# Patient Record
Sex: Male | Born: 1980
Health system: Southern US, Community
[De-identification: ages and names within clinical notes are randomized; demographics above are authoritative.]

## PROBLEM LIST (undated history)

## (undated) DIAGNOSIS — E785 Hyperlipidemia, unspecified: Secondary | ICD-10-CM

## (undated) DIAGNOSIS — E109 Type 1 diabetes mellitus without complications: Secondary | ICD-10-CM

## (undated) DIAGNOSIS — M2530 Other instability, unspecified joint: Secondary | ICD-10-CM

## (undated) HISTORY — DX: Hyperlipidemia, unspecified: E78.5

## (undated) HISTORY — DX: Type 1 diabetes mellitus without complications: E10.9

## (undated) HISTORY — DX: Other instability, unspecified joint: M25.30

---
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
## 2010-05-08 ENCOUNTER — Encounter: Admission: RE | Admit: 2010-05-08 | Discharge: 2010-05-08 | Payer: Self-pay | Admitting: Internal Medicine

## 2013-01-16 ENCOUNTER — Other Ambulatory Visit: Payer: Self-pay | Admitting: *Deleted

## 2013-01-16 MED ORDER — INSULIN LISPRO 100 UNIT/ML ~~LOC~~ SOLN: SUBCUTANEOUS | Status: DC

## 2013-08-29 DIAGNOSIS — E109 Type 1 diabetes mellitus without complications: Secondary | ICD-10-CM | POA: Insufficient documentation

## 2013-08-29 DIAGNOSIS — I10 Essential (primary) hypertension: Secondary | ICD-10-CM | POA: Insufficient documentation

## 2013-08-29 DIAGNOSIS — R200 Anesthesia of skin: Secondary | ICD-10-CM | POA: Insufficient documentation

## 2013-08-29 DIAGNOSIS — R079 Chest pain, unspecified: Secondary | ICD-10-CM | POA: Insufficient documentation

## 2013-08-29 DIAGNOSIS — Z9641 Presence of insulin pump (external) (internal): Secondary | ICD-10-CM | POA: Insufficient documentation

## 2013-08-29 DIAGNOSIS — E1069 Type 1 diabetes mellitus with other specified complication: Secondary | ICD-10-CM | POA: Insufficient documentation

## 2013-09-12 ENCOUNTER — Ambulatory Visit: Payer: Self-pay | Admitting: Internal Medicine

## 2013-10-04 DIAGNOSIS — K589 Irritable bowel syndrome without diarrhea: Secondary | ICD-10-CM | POA: Insufficient documentation

## 2013-10-04 DIAGNOSIS — M25562 Pain in left knee: Secondary | ICD-10-CM | POA: Insufficient documentation

## 2013-10-18 DIAGNOSIS — M2242 Chondromalacia patellae, left knee: Secondary | ICD-10-CM | POA: Insufficient documentation

## 2013-10-18 DIAGNOSIS — M2241 Chondromalacia patellae, right knee: Secondary | ICD-10-CM | POA: Insufficient documentation

## 2014-01-05 DIAGNOSIS — Z8639 Personal history of other endocrine, nutritional and metabolic disease: Secondary | ICD-10-CM | POA: Insufficient documentation

## 2014-01-25 DIAGNOSIS — E113299 Type 2 diabetes mellitus with mild nonproliferative diabetic retinopathy without macular edema, unspecified eye: Secondary | ICD-10-CM | POA: Insufficient documentation

## 2014-05-22 DIAGNOSIS — I152 Hypertension secondary to endocrine disorders: Secondary | ICD-10-CM | POA: Insufficient documentation

## 2014-05-29 DIAGNOSIS — I152 Hypertension secondary to endocrine disorders: Secondary | ICD-10-CM | POA: Insufficient documentation

## 2014-05-29 DIAGNOSIS — E103299 Type 1 diabetes mellitus with mild nonproliferative diabetic retinopathy without macular edema, unspecified eye: Secondary | ICD-10-CM | POA: Insufficient documentation

## 2014-10-29 DIAGNOSIS — Z794 Long term (current) use of insulin: Secondary | ICD-10-CM | POA: Insufficient documentation

## 2016-03-23 ENCOUNTER — Encounter (HOSPITAL_COMMUNITY): Payer: Self-pay | Admitting: *Deleted

## 2016-03-23 ENCOUNTER — Emergency Department (HOSPITAL_COMMUNITY)
Admission: EM | Admit: 2016-03-23 | Discharge: 2016-03-23 | Disposition: A | Discharge status: Home / Self Care | Payer: BC Managed Care – PPO | Attending: Emergency Medicine | Admitting: Emergency Medicine

## 2016-03-23 ENCOUNTER — Emergency Department (HOSPITAL_COMMUNITY): Payer: BC Managed Care – PPO

## 2016-03-23 DIAGNOSIS — E785 Hyperlipidemia, unspecified: Secondary | ICD-10-CM | POA: Insufficient documentation

## 2016-03-23 DIAGNOSIS — S60450A Superficial foreign body of right index finger, initial encounter: Secondary | ICD-10-CM | POA: Diagnosis present

## 2016-03-23 DIAGNOSIS — I10 Essential (primary) hypertension: Secondary | ICD-10-CM | POA: Diagnosis not present

## 2016-03-23 DIAGNOSIS — W458XXA Other foreign body or object entering through skin, initial encounter: Secondary | ICD-10-CM | POA: Diagnosis not present

## 2016-03-23 DIAGNOSIS — Y939 Activity, unspecified: Secondary | ICD-10-CM | POA: Diagnosis not present

## 2016-03-23 DIAGNOSIS — Z794 Long term (current) use of insulin: Secondary | ICD-10-CM | POA: Insufficient documentation

## 2016-03-23 DIAGNOSIS — S6991XA Unspecified injury of right wrist, hand and finger(s), initial encounter: Secondary | ICD-10-CM

## 2016-03-23 DIAGNOSIS — Y929 Unspecified place or not applicable: Secondary | ICD-10-CM | POA: Insufficient documentation

## 2016-03-23 DIAGNOSIS — Y999 Unspecified external cause status: Secondary | ICD-10-CM | POA: Diagnosis not present

## 2016-03-23 DIAGNOSIS — E109 Type 1 diabetes mellitus without complications: Secondary | ICD-10-CM | POA: Insufficient documentation

## 2016-03-23 MED ORDER — LIDOCAINE HCL (PF) 2 % IJ SOLN
10.0000 mL | Freq: Once | INTRAMUSCULAR | Status: AC
  Administered 2016-03-23: 10 mL via INTRADERMAL
  Filled 2016-03-23: qty 10

## 2016-03-23 MED ORDER — TETANUS-DIPHTH-ACELL PERTUSSIS 5-2.5-18.5 LF-MCG/0.5 IM SUSP
0.5000 mL | Freq: Once | INTRAMUSCULAR | Status: AC
  Administered 2016-03-23: 0.5 mL via INTRAMUSCULAR
  Filled 2016-03-23: qty 0.5

## 2016-03-23 MED ORDER — DOXYCYCLINE HYCLATE 100 MG PO CAPS: 100.0000 mg | ORAL_CAPSULE | Freq: Two times a day (BID) | ORAL | Status: DC

## 2016-03-23 NOTE — ED Notes (Signed)
Pt has fish hook in his right hand pointer finger. Bleeding is controlled at this time.

## 2016-03-23 NOTE — ED Provider Notes (Signed)
CSN: [id number]     Arrival date & time 03/23/16  1617 History  By signing my name below, I, Thomas Ramsey, attest that this documentation has been prepared under the direction and in the presence of Mattie MarlinJessica Tigerlily Christine, PA-C. Electronically Signed: Bridgette HabermannMaria Ramsey, ED Scribe. 03/23/2016. 6:58 PM.   Chief Complaint  Patient presents with  . Foreign Body in Skin   The history is provided by the patient. No language interpreter was used.    HPI Comments: Thomas Ramsey is a 35 y.o. male who presents to the Emergency Department for a fish hook stuck in his right hand pointer finger today around 4pm. Bleeding is controlled at this time. Pt states he does not feel any pain at this time, but notes he feels pain when he tries to pull the hook up. Pt got his Tdap shot updated during this visit. Patient is currently asymptomatic at this time and has no other complaints.    Past Medical History  Diagnosis Date  . HTN (hypertension)   . Hyperlipemia   . Type I (juvenile type) diabetes mellitus without mention of complication, not stated as uncontrolled    Past Surgical History  Procedure Laterality Date  . Finger surgery  1984    Reattached Index finger (right hand)   . Tympanostomy tube placement  1984  . Tonsillectomy  1992   No family history on file. Social History  Substance Use Topics  . Smoking status: Never Smoker   . Smokeless tobacco: None  . Alcohol Use: No    Review of Systems  Constitutional: Negative for fever.  Skin: Positive for wound.  Neurological: Negative for syncope.  All other systems reviewed and are negative.     Allergies  Review of patient's allergies indicates no known allergies.  Home Medications   Prior to Admission medications   Medication Sig Start Date End Date Taking? Authorizing Provider  doxycycline (VIBRAMYCIN) 100 MG capsule Take 1 capsule (100 mg total) by mouth 2 (two) times daily. 03/23/16   Jerre SimonJessica L Shafter Jupin, PA  insulin lispro (HUMALOG) 100 UNIT/ML  injection Inject 75 units once a day for diabetes, via insulin pump  Dx. 250.83 01/16/13   Tiffany L Reed, DO   BP 144/90 mmHg  Pulse 95  Temp(Src) 98.3 F (36.8 C) (Oral)  Resp 18  Ht 6' (1.829 m)  Wt 225 lb (102.059 kg)  BMI 30.51 kg/m2  SpO2 98% Physical Exam  Constitutional: He appears well-developed and well-nourished. No distress.  HENT:  Head: Normocephalic and atraumatic.  Eyes: Conjunctivae are normal.  Pulmonary/Chest: Effort normal.  Neurological: He is alert. Coordination normal.  Skin: Skin is warm and dry.  Fish hook in the right index finger palmar aspect between the PIP and DIP joint, no bleeding, redness, edema, no signs of infection. Sensation intact, cap refill less than 2 seconds, full AROM of fingers and wrist, 5/5 strength, Patient is neurovascular intact distally.  Psychiatric: He has a normal mood and affect. His behavior is normal.  Nursing note and vitals reviewed.      ED Course  .Nerve Block Date/Time: 03/23/2016 7:45 PM Performed by: Rhona RaiderFOCHT, Anthonee Gelin L Authorized by: Mattie MarlinFOCHT, Delfin Squillace L Consent: Verbal consent obtained. Risks and benefits: risks, benefits and alternatives were discussed Consent given by: patient Patient understanding: patient states understanding of the procedure being performed Required items: required blood products, implants, devices, and special equipment available Patient identity confirmed: verbally with patient and arm band Time out: Immediately prior to procedure a "time  out" was called to verify the correct patient, procedure, equipment, support staff and site/side marked as required. Indications: pain relief Body area: upper extremity Nerve: digital Laterality: right Patient sedated: no Preparation: Patient was prepped and draped in the usual sterile fashion. Patient position: sitting Needle gauge: 25 G Location technique: anatomical landmarks Local anesthetic: lidocaine 2% without epinephrine Anesthetic total: 3  ml Outcome: pain improved Patient tolerance: Patient tolerated the procedure well with no immediate complications  .Foreign Body Removal Date/Time: 03/23/2016 7:45 PM Performed by: Rhona RaiderFOCHT, Pooja Camuso L Authorized by: Mattie MarlinFOCHT, Cortni Tays L Consent: Verbal consent obtained. Risks and benefits: risks, benefits and alternatives were discussed Consent given by: patient Patient understanding: patient states understanding of the procedure being performed Required items: required blood products, implants, devices, and special equipment available Patient identity confirmed: verbally with patient and arm band Time out: Immediately prior to procedure a "time out" was called to verify the correct patient, procedure, equipment, support staff and site/side marked as required. Body area: skin General location: upper extremity Location details: right index finger Anesthesia: digital block Local anesthetic: lidocaine 1% without epinephrine Anesthetic total: 3 ml Patient sedated: no Patient cooperative: yes Removal mechanism: plyers. Dressing: antibiotic ointment and dressing applied Tendon involvement: none Depth: subcutaneous Complexity: simple 1 objects recovered. Objects recovered: fish hook Post-procedure assessment: foreign body removed Patient tolerance: Patient tolerated the procedure well with no immediate complications Comments: Xray post removal revealed no remaining metal foreign bodies.    DIAGNOSTIC STUDIES: Oxygen Saturation is 98% on RA, normal by my interpretation.    COORDINATION OF CARE: 6:58 PM Discussed treatment plan with pt at bedside which includes Rx of antibiotics, fish hook removal, and x-ray and pt agreed to plan.  Labs Review Labs Reviewed - No data to display  Imaging Review Dg Hand Complete Right  03/23/2016  CLINICAL DATA:  Fish hook stuck in thumb earlier today EXAM: RIGHT HAND - COMPLETE 3+ VIEW COMPARISON:  None. FINDINGS: Frontal, oblique, and lateral views were  obtained. There is evidence of an old healed fracture of the distal fifth metacarpal with remodeling. No acute fracture or dislocation. The joint spaces appear normal. No erosive change. No metallic foreign body noted. IMPRESSION: No metallic foreign body evident. Old trauma distal fifth metacarpal with remodeling. No acute fracture or dislocation. No apparent arthropathy. Electronically Signed   By: Bretta BangWilliam  Woodruff III M.D.   On: 03/23/2016 20:19   I have personally reviewed and evaluated these images and lab results as part of my medical decision-making.   EKG Interpretation None      MDM   Final diagnoses:  Fish hook injury of finger, right, initial encounter   Patient with fishhook in right hand. Perform digital block and removed fishhook. Post removal x-rays revealed no remaining metallic foreign body and no acute abnormalities of the finger. Patient tolerated the procedure well. Discharged patient with doxycycline and discussed side effects to include sun sensitivity. Discussed strict return precautions. Instructed patient to follow-up with his PCP within 3 days to have his wound rechecked. Patient shows understanding to the discharge instructions.  I personally performed the services described in this documentation, which was scribed in my presence. The recorded information has been reviewed and is accurate.      Jerre SimonJessica L Reilley Latorre, PA 03/23/16 2049  Bethann BerkshireJoseph Zammit, MD 03/23/16 [phone]

## 2016-03-23 NOTE — Discharge Instructions (Signed)
Take the doxycycline as prescribed and be sure to complete the entire course of antibiotics. This antibiotic will make you more sensitive to sunlight so be sure to use sunscreen and cover your skin while in the sun.  Return to the emergency department if you experience increased swelling, redness, warmth, pain, red streaks, inability to bend finger or use finger real loss of sensation of your finger, fever or chills.

## 2017-12-21 ENCOUNTER — Other Ambulatory Visit: Payer: Self-pay

## 2017-12-21 ENCOUNTER — Encounter: Payer: Self-pay | Admitting: Family Medicine

## 2017-12-21 ENCOUNTER — Ambulatory Visit (INDEPENDENT_AMBULATORY_CARE_PROVIDER_SITE_OTHER): Payer: BC Managed Care – PPO | Admitting: Family Medicine

## 2017-12-21 VITALS — BP 128/72 | HR 82 | Temp 98.0°F | Resp 14 | Ht 72.44 in | Wt 235.0 lb

## 2017-12-21 DIAGNOSIS — G8929 Other chronic pain: Secondary | ICD-10-CM

## 2017-12-21 DIAGNOSIS — E103299 Type 1 diabetes mellitus with mild nonproliferative diabetic retinopathy without macular edema, unspecified eye: Secondary | ICD-10-CM | POA: Diagnosis not present

## 2017-12-21 DIAGNOSIS — N529 Male erectile dysfunction, unspecified: Secondary | ICD-10-CM | POA: Diagnosis not present

## 2017-12-21 DIAGNOSIS — M79672 Pain in left foot: Secondary | ICD-10-CM | POA: Diagnosis not present

## 2017-12-21 DIAGNOSIS — Z0000 Encounter for general adult medical examination without abnormal findings: Secondary | ICD-10-CM

## 2017-12-21 DIAGNOSIS — M722 Plantar fascial fibromatosis: Secondary | ICD-10-CM | POA: Diagnosis not present

## 2017-12-21 LAB — COMPREHENSIVE METABOLIC PANEL WITH GFR
AG Ratio: 1.8 (calc) (ref 1.0–2.5)
ALT: 18 U/L (ref 9–46)
AST: 15 U/L (ref 10–40)
Albumin: 4.4 g/dL (ref 3.6–5.1)
Alkaline phosphatase (APISO): 84 U/L (ref 40–115)
BUN: 13 mg/dL (ref 7–25)
CO2: 29 mmol/L (ref 20–32)
Calcium: 9.3 mg/dL (ref 8.6–10.3)
Chloride: 103 mmol/L (ref 98–110)
Creat: 1.01 mg/dL (ref 0.60–1.35)
Globulin: 2.5 g/dL (ref 1.9–3.7)
Glucose, Bld: 208 mg/dL — ABNORMAL HIGH (ref 65–99)
Potassium: 4.6 mmol/L (ref 3.5–5.3)
Sodium: 140 mmol/L (ref 135–146)
Total Bilirubin: 0.3 mg/dL (ref 0.2–1.2)
Total Protein: 6.9 g/dL (ref 6.1–8.1)

## 2017-12-21 LAB — CBC WITH DIFFERENTIAL/PLATELET
BASOS PCT: 0.9 %
Basophils Absolute: 52 cells/uL (ref 0–200)
Eosinophils Absolute: 122 cells/uL (ref 15–500)
Eosinophils Relative: 2.1 %
HCT: 46.4 % (ref 38.5–50.0)
Hemoglobin: 16 g/dL (ref 13.2–17.1)
Lymphs Abs: 1456 cells/uL (ref 850–3900)
MCH: 29.6 pg (ref 27.0–33.0)
MCHC: 34.5 g/dL (ref 32.0–36.0)
MCV: 85.9 fL (ref 80.0–100.0)
MONOS PCT: 8.5 %
MPV: 9.8 fL (ref 7.5–12.5)
Neutro Abs: 3677 cells/uL (ref 1500–7800)
Neutrophils Relative %: 63.4 %
Platelets: 301 10*3/uL (ref 140–400)
RBC: 5.4 10*6/uL (ref 4.20–5.80)
RDW: 12 % (ref 11.0–15.0)
TOTAL LYMPHOCYTE: 25.1 %
WBC: 5.8 10*3/uL (ref 3.8–10.8)
WBCMIX: 493 {cells}/uL (ref 200–950)

## 2017-12-21 LAB — LIPID PANEL
Cholesterol: 190 mg/dL
HDL: 43 mg/dL
LDL Cholesterol (Calc): 130 mg/dL — ABNORMAL HIGH
Non-HDL Cholesterol (Calc): 147 mg/dL — ABNORMAL HIGH
Total CHOL/HDL Ratio: 4.4 (calc)
Triglycerides: 75 mg/dL

## 2017-12-21 MED ORDER — SILDENAFIL CITRATE 100 MG PO TABS: 50.0000 mg | ORAL_TABLET | Freq: Every day | ORAL | 3 refills | Status: DC | PRN

## 2017-12-21 NOTE — Patient Instructions (Addendum)
Release of records- sign blank release Podiatry referral  F/U 6 months

## 2017-12-21 NOTE — Progress Notes (Signed)
   Subjective:    Patient ID: Thomas Ramsey, male    DOB: [birth year], 37 y.o.   MRN: [id number]  Patient presents for New Patient CPE (is not fasting- is seeing Dr Brand MalesJohn Caine endocrinology)  Pt here to establish care and for CPE Previous PCP Endocrinology- Dr. [facility]   Seen every 3 months   Ophthalmology- has appt in May Last A1C 8.8%, INsulin Pump  Type I DM- diagnosed as a teenager has diabetic retinopathy    Has had DKA 10 years ago coma    Left sided damage, with exertion/no running      History of [place] -  [place]     Has had Echo, stress testing, no history of heart failure      Hyperlipidemia- Lipitor 40mg       Difficulty getting erections. ,Has had testosterone checked 700     Left heel pain- told plantar fascitis, seen by [facility] in November, had boot, severe pain by the end of the day     Did exercises, ice rolls, can not take steroid injection due to diabetes    [facility]- maintenance Dept  Immunizations- TDAP UTD,  He will check about PNA vaccine   Review Of Systems:  GEN- denies fatigue, fever, weight loss,weakness, recent illness HEENT- denies eye drainage, change in vision, nasal discharge, CVS- denies chest pain, palpitations RESP- denies SOB, cough, wheeze ABD- denies N/V, change in stools, abd pain GU- denies dysuria, hematuria, dribbling, incontinence MSK-+ joint pain, muscle aches, injury Neuro- denies headache, dizziness, syncope, seizure activity       Objective:    BP 128/72   Pulse 82   Temp 98 F (36.7 C) (Oral)   Resp 14   Ht 6' 0.44" (1.84 m)   Wt 235 lb (106.6 kg)   SpO2 97%   BMI 31.48 kg/m  GEN- NAD, alert and oriented x3 HEENT- PERRL, EOMI, non injected sclera, pink conjunctiva, MMM, oropharynx clear Neck- Supple, no thyromegaly CVS- RRR, no murmur RESP-CTAB ABD-NABS,soft,NT,ND EXT- No edema, TTP left heel, near plantar insertion, no swelling  Pulses- Radial,  DP- 2+        Assessment & Plan:      Problem List Items Addressed This Visit      Unprioritized   Type 1 diabetes mellitus (HCC)    Continue f/u with endocrine On ACEI for renal protection He is going to check about PNA vaccine  Keep eye appointment       Relevant Medications   atorvastatin (LIPITOR) 40 MG tablet   lisinopril (PRINIVIL,ZESTRIL) 5 MG tablet   insulin aspart (NOVOLOG) 100 UNIT/ML injection   Plantar fasciitis   Erectile dysfunction    2/2 DM, trial of Viagra, normal testosterone reported Discussed SE of the medication      Chronic heel pain, left    Referral to podiatry for 2nd opinion and treatment       Other Visit Diagnoses    Routine general medical examination at a health care facility    -  Primary   Obtain records from previous PCP, labs done, continue f/u with endocrinology   Relevant Orders   Comprehensive metabolic panel   CBC with Differential/Platelet   Lipid panel      Note: This dictation was prepared with Dragon dictation along with smaller phrase technology. Any transcriptional errors that result from this process are unintentional.

## 2017-12-21 NOTE — Assessment & Plan Note (Addendum)
2/2 DM, trial of Viagra, normal testosterone reported Discussed SE of the medication

## 2017-12-21 NOTE — Assessment & Plan Note (Signed)
Referral to podiatry for 2nd opinion and treatment

## 2017-12-21 NOTE — Assessment & Plan Note (Addendum)
Continue f/u with endocrine On ACEI for renal protection He is going to check about PNA vaccine  Keep eye appointment

## 2017-12-22 ENCOUNTER — Ambulatory Visit: Payer: BC Managed Care – PPO | Admitting: Podiatry

## 2017-12-22 ENCOUNTER — Encounter: Payer: Self-pay | Admitting: Podiatry

## 2017-12-22 ENCOUNTER — Ambulatory Visit (INDEPENDENT_AMBULATORY_CARE_PROVIDER_SITE_OTHER): Payer: BC Managed Care – PPO

## 2017-12-22 DIAGNOSIS — M722 Plantar fascial fibromatosis: Secondary | ICD-10-CM

## 2017-12-22 MED ORDER — MELOXICAM 15 MG PO TABS: 15.0000 mg | ORAL_TABLET | Freq: Every day | ORAL | 1 refills | Status: AC

## 2017-12-22 MED ORDER — MELOXICAM 15 MG PO TABS: 15.0000 mg | ORAL_TABLET | Freq: Every day | ORAL | 1 refills | Status: DC

## 2017-12-24 NOTE — Progress Notes (Signed)
   Subjective: 37 year old male with PMHx of T1DM presents today as a new patient with a chief complaint of pain and tenderness to the plantar aspect of the left heel that began about five months ago secondary to an injury. He states he was doing [employer] when he sustained the injury. [facility] and was put in a CAM boot. He has been taking Ibuprofen with no significant relief of the pain. Walking and standing for long periods of time make the pain worse. Patient presents today for further treatment and evaluation.  Past Medical History:  Diagnosis Date  . Hyperlipemia   . Other instability, unspecified joint    no joints in thumbs or great toes  . Type I (juvenile type) diabetes mellitus without mention of complication, not stated as uncontrolled      Objective: Physical Exam General: The patient is alert and oriented x3 in no acute distress.  Dermatology: Skin is warm, dry and supple bilateral lower extremities. Negative for open lesions or macerations bilateral.   Vascular: Dorsalis Pedis and Posterior Tibial pulses palpable bilateral.  Capillary fill time is immediate to all digits.  Neurological: Epicritic and protective threshold intact bilateral.   Musculoskeletal: Tenderness to palpation to the plantar aspect of the left heel along the plantar fascia. All other joints range of motion within normal limits bilateral. Strength 5/5 in all groups bilateral.   Radiographic exam:  Normal osseous mineralization. Joint spaces preserved. No fracture/dislocation/boney destruction. No other soft tissue abnormalities or radiopaque foreign bodies.   Assessment: 1. Plantar fasciitis left foot secondary to [employer] injury in November 2018  Plan of Care:  1. Patient evaluated. Xrays reviewed.   2. Plantar fascial brace dispensed.  3. Prescription for Meloxicam provided to patient.  4. Cannot receive injection or Medrol due to T1DM. 5. Return to clinic in 3  weeks. If no improvement, an MRI will be ordered.     Felecia ShellingBrent M. Akim Watkinson, DPM [facility]  Dr. Felecia ShellingBrent M. Suella Cogar, DPM    [address]                                     [place], [place] [address]                Office [phone]  Fax [phone]

## 2017-12-29 ENCOUNTER — Other Ambulatory Visit: Payer: Self-pay | Admitting: *Deleted

## 2017-12-29 MED ORDER — ATORVASTATIN CALCIUM 80 MG PO TABS: 80.0000 mg | ORAL_TABLET | Freq: Every day | ORAL | 3 refills | Status: DC

## 2018-01-12 ENCOUNTER — Encounter: Payer: Self-pay | Admitting: Podiatry

## 2018-01-12 ENCOUNTER — Ambulatory Visit: Payer: BC Managed Care – PPO | Admitting: Podiatry

## 2018-01-12 DIAGNOSIS — M722 Plantar fascial fibromatosis: Secondary | ICD-10-CM | POA: Diagnosis not present

## 2018-01-16 NOTE — Progress Notes (Signed)
   Subjective: 37 year old male with PMHx of T1DM presents today for follow up evaluation of plantar fasciitis of the left foot. He states his pain has improved significantly. He has been taking Meloxicam which has helped alleviate the symptoms. There are no modifying factors noted. Patient presents today for further treatment and evaluation.  Past Medical History:  Diagnosis Date  . Hyperlipemia   . Other instability, unspecified joint    no joints in thumbs or great toes  . Type I (juvenile type) diabetes mellitus without mention of complication, not stated as uncontrolled      Objective: Physical Exam General: The patient is alert and oriented x3 in no acute distress.  Dermatology: Skin is warm, dry and supple bilateral lower extremities. Negative for open lesions or macerations bilateral.   Vascular: Dorsalis Pedis and Posterior Tibial pulses palpable bilateral.  Capillary fill time is immediate to all digits.  Neurological: Epicritic and protective threshold intact bilateral.   Musculoskeletal: All joints range of motion within normal limits bilateral. Strength 5/5 in all groups bilateral.   Assessment: 1. Plantar fasciitis left foot secondary to [employer] injury in November 2018 - resolved  Plan of Care:  1. Patient evaluated.  2. Continue wearing fascial brace and taking Meloxicam as needed.  3. Cannot receive injection or Medrol Dose Pak secondary to T1DM.  4. Return to clinic as needed.   Felecia Shelling, DPM [facility]  Dr. Felecia Shelling, DPM    [address] [place], [place] [address]                Office [phone]  Fax [phone]

## 2018-02-15 DIAGNOSIS — H35033 Hypertensive retinopathy, bilateral: Secondary | ICD-10-CM | POA: Insufficient documentation

## 2018-02-15 DIAGNOSIS — H2513 Age-related nuclear cataract, bilateral: Secondary | ICD-10-CM | POA: Insufficient documentation

## 2018-04-20 ENCOUNTER — Telehealth: Payer: Self-pay | Admitting: *Deleted

## 2018-04-20 MED ORDER — MELOXICAM 15 MG PO TABS: 15.0000 mg | ORAL_TABLET | Freq: Every day | ORAL | 1 refills | Status: DC

## 2018-04-20 NOTE — Telephone Encounter (Signed)
Refill request for Meloxicam. Dr. Logan BoresEvans states refill as +1additional, needs an appt prior to future refills.

## 2018-06-06 ENCOUNTER — Ambulatory Visit: Payer: BC Managed Care – PPO | Admitting: Podiatry

## 2018-06-09 ENCOUNTER — Ambulatory Visit (HOSPITAL_COMMUNITY)
Admission: RE | Admit: 2018-06-09 | Discharge: 2018-06-09 | Disposition: A | Discharge status: Home / Self Care | Payer: BC Managed Care – PPO | Source: Ambulatory Visit | Attending: Family Medicine | Admitting: Family Medicine

## 2018-06-09 ENCOUNTER — Ambulatory Visit: Payer: BC Managed Care – PPO | Admitting: Family Medicine

## 2018-06-09 ENCOUNTER — Encounter: Payer: Self-pay | Admitting: Family Medicine

## 2018-06-09 ENCOUNTER — Other Ambulatory Visit: Payer: Self-pay

## 2018-06-09 VITALS — BP 122/64 | HR 68 | Temp 97.9°F | Resp 14 | Ht 72.5 in | Wt 221.0 lb

## 2018-06-09 DIAGNOSIS — G2581 Restless legs syndrome: Secondary | ICD-10-CM

## 2018-06-09 DIAGNOSIS — M62838 Other muscle spasm: Secondary | ICD-10-CM | POA: Diagnosis not present

## 2018-06-09 DIAGNOSIS — M792 Neuralgia and neuritis, unspecified: Secondary | ICD-10-CM

## 2018-06-09 DIAGNOSIS — M5442 Lumbago with sciatica, left side: Secondary | ICD-10-CM | POA: Diagnosis present

## 2018-06-09 DIAGNOSIS — M461 Sacroiliitis, not elsewhere classified: Secondary | ICD-10-CM | POA: Insufficient documentation

## 2018-06-09 DIAGNOSIS — E108 Type 1 diabetes mellitus with unspecified complications: Secondary | ICD-10-CM

## 2018-06-09 MED ORDER — PREGABALIN 25 MG PO CAPS
25.0000 mg | ORAL_CAPSULE | Freq: Two times a day (BID) | ORAL | 0 refills | Status: DC
Start: 2018-06-09 — End: 2018-12-14

## 2018-06-09 MED ORDER — CYCLOBENZAPRINE HCL 5 MG PO TABS: 5.0000 mg | ORAL_TABLET | Freq: Three times a day (TID) | ORAL | 0 refills | Status: DC | PRN

## 2018-06-09 NOTE — Patient Instructions (Signed)
Start taking lyrica at night to help with pain and muscle spasms.   Start with one pill at night for 1 week and increase to 2 pills at night for one week.   Get xrays  Use muscle relaxer as needed, cannot drive with it  Use heat therapy often!!  With frequent position changes.  See stretches for sciatica and piriformis.  Can continue to follow up with ortho.    Physical therapy should help.  I will call you with labs.  Sacroiliac Joint Dysfunction Sacroiliac joint dysfunction is a condition that causes inflammation on one or both sides of the sacroiliac (SI) joint. The SI joint connects the lower part of the spine (sacrum) with the two upper portions of the pelvis (ilium). This condition causes deep aching or burning pain in the low back. In some cases, the pain may also spread into one or both buttocks or hips or spread down the legs. What are the causes? This condition may be caused by:  Pregnancy. During pregnancy, extra stress is put on the SI joints because the pelvis widens.  Injury, such as: ? Car accidents. ? Sport-related injuries. ? Work-related injuries.  Having one leg that is shorter than the other.  Conditions that affect the joints, such as: ? Rheumatoid arthritis. ? Gout. ? Psoriatic arthritis. ? Joint infection (septic arthritis).  Sometimes, the cause of SI joint dysfunction is not known. What are the signs or symptoms? Symptoms of this condition include:  Aching or burning pain in the lower back. The pain may also spread to other areas, such as: ? Buttocks. ? Groin. ? Thighs and legs.  Muscle spasms in or around the painful areas.  Increased pain when standing, walking, running, stair climbing, bending, or lifting.  How is this diagnosed? Your health care provider will do a physical exam and take your medical history. During the exam, the health care provider may move one or both of your legs to different positions to check for pain. Various tests may  be done to help verify the diagnosis, including:  Imaging tests to look for other causes of pain. These may include: ? MRI. ? CT scan. ? Bone scan.  Diagnostic injection. A numbing medicine is injected into the SI joint using a needle. If the pain is temporarily improved or stopped after the injection, this can indicate that SI joint dysfunction is the problem.  How is this treated? Treatment may vary depending on the cause and severity of your condition. Treatment options may include:  Applying ice or heat to the lower back area. This can help to reduce pain and muscle spasms.  Medicines to relieve pain or inflammation or to relax the muscles.  Wearing a back brace (sacroiliac brace) to help support the joint while your back is healing.  Physical therapy to increase muscle strength around the joint and flexibility at the joint. This may also involve learning proper body positions and ways of moving to relieve stress on the joint.  Direct manipulation of the SI joint.  Injections of steroid medicine into the joint in order to reduce pain and swelling.  Radiofrequency ablation to burn away nerves that are carrying pain messages from the joint.  Use of a device that provides electrical stimulation in order to reduce pain at the joint.  Surgery to put in screws and plates that limit or prevent joint motion. This is rare.  Follow these instructions at home:  Rest as needed. Limit your activities as directed by  your health care provider.  Take medicines only as directed by your health care provider.  If directed, apply ice to the affected area: ? Put ice in a plastic bag. ? Place a towel between your skin and the bag. ? Leave the ice on for 20 minutes, 2-3 times per day.  Use a heating pad or a moist heat pack as directed by your health care provider.  Exercise as directed by your health care provider or physical therapist.  Keep all follow-up visits as directed by your health  care provider. This is important. Contact a health care provider if:  Your pain is not controlled with medicine.  You have a fever.  You have increasingly severe pain. Get help right away if:  You have weakness, numbness, or tingling in your legs or feet.  You lose control of your bladder or bowel. This information is not intended to replace advice given to you by your health care provider. Make sure you discuss any questions you have with your health care provider. Document Released: 12/04/2008 Document Revised: 02/13/2016 Document Reviewed: 05/15/2014 Elsevier Interactive Patient Education  2018 Else[employer] Inc.    Sciatica Sciatica is pain, numbness, weakness, or tingling along your sciatic nerve. The sciatic nerve starts in the lower back and goes down the back of each leg. Sciatica happens when this nerve is pinched or has pressure put on it. Sciatica usually goes away on its own or with treatment. Sometimes, sciatica may keep coming back (recur). Follow these instructions at home: Medicines  Take over-the-counter and prescription medicines only as told by your doctor.  Do not drive or use heavy machinery while taking prescription pain medicine. Managing pain  If directed, put ice on the affected area. ? Put ice in a plastic bag. ? Place a towel between your skin and the bag. ? Leave the ice on for 20 minutes, 2-3 times a day.  After icing, apply heat to the affected area before you exercise or as often as told by your doctor. Use the heat source that your doctor tells you to use, such as a moist heat pack or a heating pad. ? Place a towel between your skin and the heat source. ? Leave the heat on for 20-30 minutes. ? Remove the heat if your skin turns bright red. This is especially important if you are unable to feel pain, heat, or cold. You may have a greater risk of getting burned. Activity  Return to your normal activities as told by your doctor. Ask your doctor what  activities are safe for you. ? Avoid activities that make your sciatica worse.  Take short rests during the day. Rest in a lying or standing position. This is usually better than sitting to rest. ? When you rest for a long time, do some physical activity or stretching between periods of rest. ? Avoid sitting for a long time without moving. Get up and move around at least one time each hour.  Exercise and stretch regularly, as told by your doctor.  Do not lift anything that is heavier than 10 lb (4.5 kg) while you have symptoms of sciatica. ? Avoid lifting heavy things even when you do not have symptoms. ? Avoid lifting heavy things over and over.  When you lift objects, always lift in a way that is safe for your body. To do this, you should: ? Bend your knees. ? Keep the object close to your body. ? Avoid twisting. General instructions  Use good  posture. ? Avoid leaning forward when you are sitting. ? Avoid hunching over when you are standing.  Stay at a healthy weight.  Wear comfortable shoes that support your feet. Avoid wearing high heels.  Avoid sleeping on a mattress that is too soft or too hard. You might have less pain if you sleep on a mattress that is firm enough to support your back.  Keep all follow-up visits as told by your doctor. This is important. Contact a doctor if:  You have pain that: ? Wakes you up when you are sleeping. ? Gets worse when you lie down. ? Is worse than the pain you have had in the past. ? Lasts longer than 4 weeks.  You lose weight for without trying. Get help right away if:  You cannot control when you pee (urinate) or poop (have a bowel movement).  You have weakness in any of these areas and it gets worse. ? Lower back. ? Lower belly (pelvis). ? Butt (buttocks). ? Legs.  You have redness or swelling of your back.  You have a burning feeling when you pee. This information is not intended to replace advice given to you by your  health care provider. Make sure you discuss any questions you have with your health care provider. Document Released: 06/16/2008 Document Revised: 02/13/2016 Document Reviewed: 05/17/2015 Elsevier Interactive Patient Education  2018 [employer].    Piriformis Syndrome Rehab Ask your health care provider which exercises are safe for you. Do exercises exactly as told by your health care provider and adjust them as directed. It is normal to feel mild stretching, pulling, tightness, or discomfort as you do these exercises, but you should stop right away if you feel sudden pain or your pain gets worse.Do not begin these exercises until told by your health care provider. Stretching and range of motion exercises These exercises warm up your muscles and joints and improve the movement and flexibility of your hip and pelvis. These exercises also help to relieve pain, numbness, and tingling. Exercise A: Hip rotators  1. Lie on your back on a firm surface. 2. Pull your left / right knee toward your same shoulder with your left / right hand until your knee is pointing toward the ceiling. Hold your left / right ankle with your other hand. 3. Keeping your knee steady, gently pull your left / right ankle toward your other shoulder until you feel a stretch in your buttocks. 4. Hold this position for __________ seconds. Repeat __________ times. Complete this stretch __________ times a day. Exercise B: Hip extensors 1. Lie on your back on a firm surface. Both of your legs should be straight. 2. Pull your left / right knee to your chest. Hold your leg in this position by holding onto the back of your thigh or the front of your knee. 3. Hold this position for __________ seconds. 4. Slowly return to the starting position. Repeat __________ times. Complete this stretch __________ times a day. Strengthening exercises These exercises build strength and endurance in your hip and thigh muscles. Endurance is the  ability to use your muscles for a long time, even after they get tired. Exercise C: Straight leg raises ( hip abductors) 1. Lie on your side with your left / right leg in the top position. Lie so your head, shoulder, knee, and hip line up. Bend your bottom knee to help you balance. 2. Lift your top leg up 4-6 inches (10-15 cm), keeping your toes pointed straight  ahead. 3. Hold this position for __________ seconds. 4. Slowly lower your leg to the starting position. Let your muscles relax completely. Repeat __________ times. Complete this exercise__________ times a day. Exercise D: Hip abductors and rotators, quadruped  1. Get on your hands and knees on a firm, lightly padded surface. Your hands should be directly below your shoulders, and your knees should be directly below your hips. 2. Lift your left / right knee out to the side. Keep your knee bent. Do not twist your body. 3. Hold this position for __________ seconds. 4. Slowly lower your leg. Repeat __________ times. Complete this exercise__________ times a day. Exercise E: Straight leg raises ( hip extensors) 1. Lie on your abdomen on a bed or a firm surface with a pillow under your hips. 2. Squeeze your buttock muscles and lift your left / right thigh off the bed. Do not let your back arch. 3. Hold this position for __________ seconds. 4. Slowly return to the starting position. Let your muscles relax completely before doing another repetition. Repeat __________ times. Complete this exercise__________ times a day. This information is not intended to replace advice given to you by your health care provider. Make sure you discuss any questions you have with your health care provider. Document Released: 09/07/2005 Document Revised: 05/12/2016 Document Reviewed: 08/20/2015 Elsevier Interactive Patient Education  [employer]2018 Elsevier Inc.

## 2018-06-09 NOTE — Progress Notes (Signed)
Patient ID: Thomas CocoJared H Ramsey, male    DOB: 1980-10-31, 37 y.o.   MRN: [id number]  PCP: Salley Scarleturham, Kawanta F, MD  Chief Complaint  Patient presents with  . L Sided Back Pain    x1 week- sciatic pain from L side of back down leg    Subjective:   Thomas CocoJared H Bisher is a 37 y.o. male, past medical history of type 1 diabetes, chronic left heel pain and intermittent back pain, presents to clinic with CC of 1 to 2 weeks of worsening left low back and buttock pain that radiates down the outside of his left leg.  Not new, has had a several times in the last year to, he has seen orthopedist foot and ankle specialist because of similar pain in his heel however his orthopedist believes that this may be because of his low back.  Dorene SorrowJerry states that pain is a burning nerve pain is been fairly constant and more severe, gradually worsening for the last week.  It is exacerbated with any period of immobility, with sitting, and much worse laying down at night.  He has had associated muscle spasms and fasciculations that he can see on his leg and upper thigh.  His symptoms have become severe with this particularly at night and is keeping him awake.  He believes that the muscle spasms and twitching are causing the muscles to be sore as well.  Twitching the pain is causing him to walk slightly different he states that he needs to rotate his left foot externally kind of walks sideways on it to help prevent some of the spasms from worsening.  He denies any history of back strain or injury.  He has not had imaging of his back done before.  States that his sugars are well controlled on his insulin pump and recent exacerbation of this pain has not changed his sugars.  Tend to only get elevated with illnesses like the flu.  In the past he has had his nerve pain somewhat similar to this worked up and he has had blood flow checked to his lower extremities and this was normal.   He has been advised in the past to avoid any steroid  use or injections at all. He has been taking Aleve for his pain which is not improved much. He has been given narcotic pain medicine in the past and it does nothing for his pain so he does not wish to have any.   He does have follow-up this week with Sim[facility]Brad [facility] he believes he is an orthopedist.  Cussing with this with him he had advised that they may need to get some imaging of his back. Is not done physical therapy in the past  He denies any abdominal pain, flank pain, changes in urination, incontinence of urine or stool, saddle anesthesia, focal weakness or numbness, denies any lower extremity pallor, rubor, swelling, or indurated or firm muscles.  No loss of hair or skin changes to his lower extremities. He denies any urinary frequency, feelings of dehydration, dry mouth or near syncope.    Patient Active Problem List   Diagnosis Date Noted  . Chronic heel pain, left 12/21/2017  . Plantar fasciitis 12/21/2017  . Erectile dysfunction 12/21/2017  . Type 1 diabetes mellitus (HCC) 08/29/2013     Prior to Admission medications   Medication Sig Start Date End Date Taking? Authorizing Provider  atorvastatin (LIPITOR) 80 MG tablet Take 1 tablet (80 mg  total) by mouth daily. 12/29/17  Yes [place], Velna Hatchet, MD  insulin aspart (NOVOLOG) 100 UNIT/ML injection Inject 75 Units into the skin continuous. Via pump   Yes [provider]  lisinopril (PRINIVIL,ZESTRIL) 5 MG tablet Take 5 mg by mouth daily.   Yes [provider]  sildenafil (VIAGRA) 100 MG tablet Take 0.5-1 tablets (50-100 mg total) by mouth daily as needed for erectile dysfunction. 12/21/17  Yes [place], Velna Hatchet, MD  cyclobenzaprine (FLEXERIL) 5 MG tablet Take 1-2 tablets (5-10 mg total) by mouth 3 (three) times daily as needed for muscle spasms. 06/09/18   Danelle Berry, PA-C  pregabalin (LYRICA) 25 MG capsule Take 1 capsule (25 mg total) by mouth 2 (two) times daily. 06/09/18   Danelle Berry,  PA-C     No Known Allergies   History reviewed. No pertinent family history.   Social History   Socioeconomic History  . Marital status: Single    Spouse name: Not on file  . Number of children: Not on file  . Years of education: Not on file  . Highest education level: Not on file  Occupational History    Employer: [employer]  Social Needs  . Financial resource strain: Not hard at all  . Food insecurity:    Worry: Never true    Inability: Never true  . Transportation needs:    Medical: No    Non-medical: No  Tobacco Use  . Smoking status: Never Smoker  . Smokeless tobacco: Never Used  Substance and Sexual Activity  . Alcohol use: No  . Drug use: No  . Sexual activity: Yes  Lifestyle  . Physical activity:    Days per week: Not on file    Minutes per session: Not on file  . Stress: Not on file  Relationships  . Social connections:    Talks on phone: Not on file    Gets together: Not on file    Attends religious service: Not on file    Active member of club or organization: Not on file    Attends meetings of clubs or organizations: Not on file    Relationship status: Not on file  . Intimate partner violence:    Fear of current or ex partner: Not on file    Emotionally abused: Not on file    Physically abused: Not on file    Forced sexual activity: Not on file  Other Topics Concern  . Not on file  Social History Narrative  . Not on file     Review of Systems  Constitutional: Negative.  Negative for activity change, appetite change, chills, diaphoresis, fatigue, fever and unexpected weight change.  HENT: Negative.   Eyes: Negative.   Respiratory: Negative.   Cardiovascular: Negative.   Gastrointestinal: Negative.   Endocrine: Negative for polydipsia, polyphagia and polyuria.  Genitourinary: Negative.   Musculoskeletal: Positive for back pain, gait problem and myalgias. Negative for joint swelling, neck pain and neck stiffness.  Skin:  Negative.  Negative for color change, pallor, rash and wound.  Allergic/Immunologic: Negative.   Neurological: Negative for dizziness, weakness and numbness.  Hematological: Negative.  Negative for adenopathy. Does not bruise/bleed easily.  Psychiatric/Behavioral: Negative.   All other systems reviewed and are negative.      Objective:    Vitals:   06/09/18 0914  BP: 122/64  Pulse: 68  Resp: 14  Temp: 97.9 F (36.6 C)  TempSrc: Oral  SpO2: 97%  Weight: 221 lb (100.2 kg)  Height:  6' 0.5" (1.842 m)      Physical Exam  Constitutional: He appears well-developed and well-nourished. No distress.  HENT:  Head: Normocephalic and atraumatic.  Nose: Nose normal.  Mouth/Throat: Oropharynx is clear and moist.  Eyes: Pupils are equal, round, and reactive to light. Conjunctivae are normal. Right eye exhibits no discharge. Left eye exhibits no discharge.  Neck: Normal range of motion. Neck supple. No tracheal deviation present.  Cardiovascular: Normal rate and regular rhythm.  Pulses:      Dorsalis pedis pulses are 2+ on the right side, and 2+ on the left side.       Posterior tibial pulses are 2+ on the right side, and 2+ on the left side.  No lower extremity edema   Pulmonary/Chest: Effort normal. No stridor. No respiratory distress.  Abdominal: Normal appearance. There is no tenderness. There is no CVA tenderness.  Musculoskeletal: Normal range of motion.  No tenderness to palpation or step-off from cervical to lumbar spine along spinal processes, no paraspinal muscle tenderness to palpation from cervical to lumbar spine Left SI joint tenderness Normal range of motion of back Range of motion of bilateral hips No current visualized fasciculations or muscle spasms of bilateral lower extremities All muscle compartments soft  Neurological: He is alert. He has normal strength. He displays no tremor. No sensory deficit. He exhibits normal muscle tone. Coordination and gait normal.    Normal sensation to light touch to bilateral lower extremities 5/5 strength bilaterally with dorsiflexion, plantarflexion, flexion and extension at the knee, flexion of bilateral hips Normal gait  Skin: Skin is warm and dry. Capillary refill takes less than 2 seconds. No rash noted. He is not diaphoretic. No cyanosis. No pallor. Nails show no clubbing.  Extremities - no atrophy, no loss of hair, pallor or rubor  Psychiatric: He has a normal mood and affect. His behavior is normal.  Nursing note and vitals reviewed.         Assessment & Plan:   DENNISON MCDAID is a 37 y/o male with T1DM on insulin pump, well controlled, hx of some left heel arthritis and plantar fasciitis, ED, HLD,with hx of PAD presents with recurrent but more severe left low back and buttocks pain with radiation down his left leg.  No history of injury or strain.  History is negative for symptoms concerning for cauda equina, his major risk factors is his type 1 diabetes which may make him immunocompromised and more susceptible to other etiologies of low back pain however this does seem to be muscle skeletal or inflammatory particularly to the left SI joint.  He is well-appearing without any other systemic symptoms. There is no concerning history or physical exam findings suggestive of peripheral artery disease and he has had this work-up before so this is reassuring.   He has normal range of motion, sensation and strength to his bilateral lower extremities.  Does report associated severe muscle spasms and fasciculations in his leg and calf that are much worse at night, he is not having any currently.  The symptoms are new seem to be related to the low back and buttocks pain and have not been present before with other peripheral neuropathy or arthritis pains.  Plan to work-up as outlined below    ICD-10-CM   1. Low back pain with left-sided sciatica, unspecified back pain laterality, unspecified chronicity M54.42  cyclobenzaprine (FLEXERIL) 5 MG tablet    DG Lumbar Spine Complete    DG Si Joints  Ambulatory referral to Physical Therapy    pregabalin (LYRICA) 25 MG capsule  2. Sacroiliitis (HCC) M46.1 cyclobenzaprine (FLEXERIL) 5 MG tablet    DG Lumbar Spine Complete    DG Si Joints    C-reactive protein    Sedimentation Rate    CBC with Differential    Ambulatory referral to Physical Therapy    pregabalin (LYRICA) 25 MG capsule  3. Muscle spasm of left lower extremity M62.838 cyclobenzaprine (FLEXERIL) 5 MG tablet    BASIC METABOLIC PANEL WITH GFR    Magnesium    CK    C-reactive protein    Sedimentation Rate    CBC with Differential    pregabalin (LYRICA) 25 MG capsule  4. Restless leg G25.81 pregabalin (LYRICA) 25 MG capsule  5. Peripheral neuropathic pain M79.2 Ambulatory referral to Physical Therapy    pregabalin (LYRICA) 25 MG capsule  6. Type 1 diabetes mellitus with complication (HCC) E10.8 BASIC METABOLIC PANEL WITH GFR    CBC with Differential    Feel that I do need to check his electrolytes, magnesium and kidney function with history of type 1 diabetes and with his severe muscle spasms.  Patient will check CK for any signs of muscle damage although I do not feel any firm or indurated muscles currently no concern for any compartment syndrome.  Will check acute phase reactants, imaging of lumbar spine and SI joint. He is encouraged to continue to use over-the-counter NSAIDs, change positions frequently, use heat therapy often, muscle relaxers for low back pain, stiffness and for muscle spasms and will try work for nerve pain/leg pain and restless leg symptoms at night. Referral for physical therapy. Depending on results patient may require MRI but I do believe he is following up with his orthopedist regarding this   Danelle Berry, PA-C 06/09/18 10:00 AM

## 2018-06-10 LAB — CBC WITH DIFFERENTIAL/PLATELET
BASOS ABS: 27 {cells}/uL (ref 0–200)
Basophils Relative: 0.4 %
Eosinophils Absolute: 68 cells/uL (ref 15–500)
Eosinophils Relative: 1 %
HEMATOCRIT: 47.9 % (ref 38.5–50.0)
Hemoglobin: 16.4 g/dL (ref 13.2–17.1)
LYMPHS ABS: 1333 {cells}/uL (ref 850–3900)
MCH: 30 pg (ref 27.0–33.0)
MCHC: 34.2 g/dL (ref 32.0–36.0)
MCV: 87.7 fL (ref 80.0–100.0)
MPV: 10.2 fL (ref 7.5–12.5)
Monocytes Relative: 6.4 %
NEUTROS PCT: 72.6 %
Neutro Abs: 4937 cells/uL (ref 1500–7800)
PLATELETS: 311 10*3/uL (ref 140–400)
RBC: 5.46 10*6/uL (ref 4.20–5.80)
RDW: 12 % (ref 11.0–15.0)
TOTAL LYMPHOCYTE: 19.6 %
WBC: 6.8 10*3/uL (ref 3.8–10.8)
WBCMIX: 435 {cells}/uL (ref 200–950)

## 2018-06-10 LAB — BASIC METABOLIC PANEL WITH GFR
BUN: 20 mg/dL (ref 7–25)
CALCIUM: 9.9 mg/dL (ref 8.6–10.3)
CHLORIDE: 99 mmol/L (ref 98–110)
CO2: 29 mmol/L (ref 20–32)
Creat: 0.94 mg/dL (ref 0.60–1.35)
GFR, EST AFRICAN AMERICAN: 120 mL/min/{1.73_m2} (ref >=60)
GFR, Est Non African American: 103 mL/min/{1.73_m2} (ref >=60)
Glucose, Bld: 233 mg/dL — ABNORMAL HIGH (ref 65–99)
POTASSIUM: 4.6 mmol/L (ref 3.5–5.3)
Sodium: 137 mmol/L (ref 135–146)

## 2018-06-10 LAB — MAGNESIUM: Magnesium: 1.6 mg/dL (ref 1.5–2.5)

## 2018-06-10 LAB — SEDIMENTATION RATE: Sed Rate: 2 mm/h (ref 0–15)

## 2018-06-10 LAB — C-REACTIVE PROTEIN: CRP: 1.1 mg/L (ref <8.0)

## 2018-06-10 LAB — CK: Total CK: 146 U/L (ref 44–196)

## 2018-06-20 ENCOUNTER — Ambulatory Visit: Payer: BC Managed Care – PPO | Admitting: Podiatry

## 2018-06-20 ENCOUNTER — Ambulatory Visit: Payer: Self-pay

## 2018-06-20 DIAGNOSIS — M722 Plantar fascial fibromatosis: Secondary | ICD-10-CM

## 2018-06-20 MED ORDER — DICLOFENAC SODIUM 75 MG PO TBEC: 75.0000 mg | DELAYED_RELEASE_TABLET | Freq: Two times a day (BID) | ORAL | 1 refills | Status: DC

## 2018-06-20 NOTE — Progress Notes (Signed)
   Subjective: 37 year old male with PMHx of T1DM presents today for recurrent issue of plantar fasciitis to the left foot.  Patient was last seen 01/12/2018.  Patient states that the pain never went away however over the last few weeks the pain is increased significantly.  Pain is currently 7/10.  Ambulating aggravates his injury.  Currently there is nothing to alleviate his symptoms.  Past Medical History:  Diagnosis Date  . Hyperlipemia   . Other instability, unspecified joint    no joints in thumbs or great toes  . Type I (juvenile type) diabetes mellitus without mention of complication, not stated as uncontrolled      Objective: Physical Exam General: The patient is alert and oriented x3 in no acute distress.  Dermatology: Skin is warm, dry and supple bilateral lower extremities. Negative for open lesions or macerations bilateral.   Vascular: Dorsalis Pedis and Posterior Tibial pulses palpable bilateral.  Capillary fill time is immediate to all digits.  Neurological: Epicritic and protective threshold intact bilateral.   Musculoskeletal: All joints range of motion within normal limits bilateral. Strength 5/5 in all groups bilateral.  Significant pain on palpation to the medial and lateral aspect of the calcaneus at the insertion of the plantar fascia.  Assessment: 1.  Plantar fasciitis left-medial and lateral bands  Plan of Care:  1. Patient evaluated.  2.  Prescription for diclofenac 75 mg twice daily 3.  Cannot receive injection or Medrol Dosepak secondary to T1DM 4.  Immobilization cam boot dispensed today.  Weightbearing as tolerated x4 weeks 5.  Return to clinic in 4 weeks  Felecia Shelling, DPM [facility]  Dr. Felecia Shelling, DPM    [address] [place], [place] [address]                Office [phone]  Fax [phone]

## 2018-06-22 ENCOUNTER — Ambulatory Visit (HOSPITAL_COMMUNITY): Payer: BC Managed Care – PPO | Admitting: Physical Therapy

## 2018-12-14 ENCOUNTER — Encounter: Payer: Self-pay | Admitting: Family Medicine

## 2018-12-14 ENCOUNTER — Ambulatory Visit: Payer: BC Managed Care – PPO | Admitting: Family Medicine

## 2018-12-14 ENCOUNTER — Other Ambulatory Visit: Payer: Self-pay

## 2018-12-14 VITALS — BP 126/80 | HR 76 | Temp 98.1°F | Resp 14 | Ht 72.5 in | Wt 230.0 lb

## 2018-12-14 DIAGNOSIS — E109 Type 1 diabetes mellitus without complications: Secondary | ICD-10-CM | POA: Diagnosis not present

## 2018-12-14 DIAGNOSIS — E785 Hyperlipidemia, unspecified: Secondary | ICD-10-CM

## 2018-12-14 DIAGNOSIS — E6609 Other obesity due to excess calories: Secondary | ICD-10-CM | POA: Diagnosis not present

## 2018-12-14 DIAGNOSIS — E669 Obesity, unspecified: Secondary | ICD-10-CM | POA: Insufficient documentation

## 2018-12-14 DIAGNOSIS — Z6830 Body mass index (BMI) 30.0-30.9, adult: Secondary | ICD-10-CM

## 2018-12-14 DIAGNOSIS — E1169 Type 2 diabetes mellitus with other specified complication: Secondary | ICD-10-CM | POA: Diagnosis not present

## 2018-12-14 MED ORDER — LISINOPRIL 5 MG PO TABS: 5.0000 mg | ORAL_TABLET | Freq: Every day | ORAL | 2 refills | Status: DC

## 2018-12-14 MED ORDER — ATORVASTATIN CALCIUM 80 MG PO TABS: 80.0000 mg | ORAL_TABLET | Freq: Every day | ORAL | 3 refills | Status: DC

## 2018-12-14 NOTE — Assessment & Plan Note (Signed)
Obesity discussed diet, he is type 1 diabetic, will defer to his endocrinologist to adjust pump settings I will obtain the labs today  Pt unable to leave urine micro

## 2018-12-14 NOTE — Assessment & Plan Note (Signed)
Continue liptor , goal LDL less than 100 Check LFT

## 2018-12-14 NOTE — Progress Notes (Signed)
   Subjective:    Patient ID: Thomas Ramsey, male    DOB: Sep 11, 1981, 38 y.o.   MRN: [id number]  Patient presents for Follow-up (is fasting)   Pt here to f/u diabetic medication, has insulin pump, has not seen his endocrinology overdue for appt but cheaper for him to come here and get labs with insurance.  CBG was up to 300's recently, he has gained 10lbs since his last visit in the fall, admits he was stress eating and not working out after he an fiancee split, but he is in a better place now  He is taking lisinopril for renal protection  Hyperlipidemia-taking lipitor   No specific concerns  Health maintenance updated   Review Of Systems:  GEN- denies fatigue, fever, weight loss,weakness, recent illness HEENT- denies eye drainage, change in vision, nasal discharge, CVS- denies chest pain, palpitations RESP- denies SOB, cough, wheeze ABD- denies N/V, change in stools, abd pain GU- denies dysuria, hematuria, dribbling, incontinence MSK- denies joint pain, muscle aches, injury Neuro- denies headache, dizziness, syncope, seizure activity       Objective:    BP 126/80   Pulse 76   Temp 98.1 F (36.7 C) (Oral)   Resp 14   Ht 6' 0.5" (1.842 m)   Wt 230 lb (104.3 kg)   SpO2 98%   BMI 30.76 kg/m  GEN- NAD, alert and oriented x3 HEENT- PERRL, EOMI, non injected sclera, pink conjunctiva, MMM, oropharynx clear Neck- Supple, no thyromegaly CVS- RRR, no murmur RESP-CTAB ABD-NABS,soft,NT,ND EXT- No edema Pulses- Radial, DP- 2+        Assessment & Plan:      Problem List Items Addressed This Visit      Unprioritized   Hyperlipidemia associated with type 2 diabetes mellitus (HCC)    Continue liptor , goal LDL less than 100 Check LFT      Relevant Medications   lisinopril (PRINIVIL,ZESTRIL) 5 MG tablet   atorvastatin (LIPITOR) 80 MG tablet   Obesity    Obesity discussed diet, he is type 1 diabetic, will defer to his endocrinologist to adjust pump settings I  will obtain the labs today  Pt unable to leave urine micro       Type 1 diabetes mellitus (HCC) - Primary   Relevant Medications   lisinopril (PRINIVIL,ZESTRIL) 5 MG tablet   atorvastatin (LIPITOR) 80 MG tablet   Other Relevant Orders   CBC with Differential/Platelet   Comprehensive metabolic panel   Hemoglobin A1c   Lipid panel   HM DIABETES FOOT EXAM (Completed)   Microalbumin / creatinine urine ratio      Note: This dictation was prepared with Dragon dictation along with smaller phrase technology. Any transcriptional errors that result from this process are unintentional.

## 2018-12-14 NOTE — Patient Instructions (Signed)
F/U 6 months for Physical  

## 2018-12-15 LAB — COMPREHENSIVE METABOLIC PANEL
AG Ratio: 2.3 (calc) (ref 1.0–2.5)
ALT: 34 U/L (ref 9–46)
AST: 19 U/L (ref 10–40)
Albumin: 4.8 g/dL (ref 3.6–5.1)
Alkaline phosphatase (APISO): 94 U/L (ref 36–130)
BILIRUBIN TOTAL: 0.6 mg/dL (ref 0.2–1.2)
BUN: 13 mg/dL (ref 7–25)
CALCIUM: 9.7 mg/dL (ref 8.6–10.3)
CHLORIDE: 101 mmol/L (ref 98–110)
CO2: 31 mmol/L (ref 20–32)
Creat: 0.88 mg/dL (ref 0.60–1.35)
Globulin: 2.1 g/dL (calc) (ref 1.9–3.7)
Glucose, Bld: 187 mg/dL — ABNORMAL HIGH (ref 65–99)
POTASSIUM: 4.8 mmol/L (ref 3.5–5.3)
SODIUM: 139 mmol/L (ref 135–146)
TOTAL PROTEIN: 6.9 g/dL (ref 6.1–8.1)

## 2018-12-15 LAB — CBC WITH DIFFERENTIAL/PLATELET
Absolute Monocytes: 390 cells/uL (ref 200–950)
Basophils Absolute: 43 cells/uL (ref 0–200)
Basophils Relative: 0.7 %
EOS PCT: 1.8 %
Eosinophils Absolute: 110 cells/uL (ref 15–500)
HEMATOCRIT: 46.6 % (ref 38.5–50.0)
Hemoglobin: 15.8 g/dL (ref 13.2–17.1)
LYMPHS ABS: 1513 {cells}/uL (ref 850–3900)
MCH: 29.9 pg (ref 27.0–33.0)
MCHC: 33.9 g/dL (ref 32.0–36.0)
MCV: 88.3 fL (ref 80.0–100.0)
MONOS PCT: 6.4 %
MPV: 9.9 fL (ref 7.5–12.5)
NEUTROS PCT: 66.3 %
Neutro Abs: 4044 cells/uL (ref 1500–7800)
Platelets: 306 10*3/uL (ref 140–400)
RBC: 5.28 10*6/uL (ref 4.20–5.80)
RDW: 11.9 % (ref 11.0–15.0)
Total Lymphocyte: 24.8 %
WBC: 6.1 10*3/uL (ref 3.8–10.8)

## 2018-12-15 LAB — HEMOGLOBIN A1C
Hgb A1c MFr Bld: 8.8 % of total Hgb — ABNORMAL HIGH (ref <5.7)
Mean Plasma Glucose: 206 (calc)
eAG (mmol/L): 11.4 (calc)

## 2018-12-15 LAB — LIPID PANEL
Cholesterol: 126 mg/dL (ref <200)
HDL: 38 mg/dL — ABNORMAL LOW (ref >=40)
LDL Cholesterol (Calc): 75 mg/dL (calc)
NON-HDL CHOLESTEROL (CALC): 88 mg/dL (ref <130)
TRIGLYCERIDES: 53 mg/dL (ref <150)
Total CHOL/HDL Ratio: 3.3 (calc) (ref <5.0)

## 2018-12-19 ENCOUNTER — Other Ambulatory Visit: Payer: Self-pay | Admitting: *Deleted

## 2018-12-19 MED ORDER — LISINOPRIL 5 MG PO TABS: 5.0000 mg | ORAL_TABLET | Freq: Every day | ORAL | 2 refills | Status: DC

## 2019-02-17 LAB — HM DIABETES EYE EXAM

## 2019-02-27 ENCOUNTER — Other Ambulatory Visit: Payer: BC Managed Care – PPO

## 2019-04-25 LAB — HEMOGLOBIN A1C: Hemoglobin A1C: 8.4

## 2019-06-16 ENCOUNTER — Other Ambulatory Visit: Payer: Self-pay

## 2019-06-16 ENCOUNTER — Ambulatory Visit: Payer: BC Managed Care – PPO | Admitting: Family Medicine

## 2019-06-16 ENCOUNTER — Ambulatory Visit (INDEPENDENT_AMBULATORY_CARE_PROVIDER_SITE_OTHER): Payer: BC Managed Care – PPO | Admitting: Family Medicine

## 2019-06-16 ENCOUNTER — Encounter: Payer: Self-pay | Admitting: *Deleted

## 2019-06-16 ENCOUNTER — Encounter: Payer: Self-pay | Admitting: Family Medicine

## 2019-06-16 VITALS — BP 138/72 | HR 86 | Temp 98.3°F | Resp 14 | Ht 72.5 in | Wt 236.0 lb

## 2019-06-16 DIAGNOSIS — E1169 Type 2 diabetes mellitus with other specified complication: Secondary | ICD-10-CM

## 2019-06-16 DIAGNOSIS — Z23 Encounter for immunization: Secondary | ICD-10-CM | POA: Diagnosis not present

## 2019-06-16 DIAGNOSIS — E785 Hyperlipidemia, unspecified: Secondary | ICD-10-CM

## 2019-06-16 DIAGNOSIS — Z0001 Encounter for general adult medical examination with abnormal findings: Secondary | ICD-10-CM

## 2019-06-16 DIAGNOSIS — E6609 Other obesity due to excess calories: Secondary | ICD-10-CM

## 2019-06-16 DIAGNOSIS — Z0000 Encounter for general adult medical examination without abnormal findings: Secondary | ICD-10-CM

## 2019-06-16 DIAGNOSIS — Z6831 Body mass index (BMI) 31.0-31.9, adult: Secondary | ICD-10-CM

## 2019-06-16 DIAGNOSIS — L301 Dyshidrosis [pompholyx]: Secondary | ICD-10-CM

## 2019-06-16 MED ORDER — CLOBETASOL PROPIONATE 0.05 % EX CREA: 1.0000 "application " | TOPICAL_CREAM | Freq: Two times a day (BID) | CUTANEOUS | 1 refills | Status: DC

## 2019-06-16 NOTE — Patient Instructions (Signed)
Use clobetasol and the O keefe cream  F/U 6 MONTHS
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
## 2019-06-17 LAB — CBC WITH DIFFERENTIAL/PLATELET
Absolute Monocytes: 468 cells/uL (ref 200–950)
Basophils Absolute: 39 cells/uL (ref 0–200)
Basophils Relative: 0.6 %
Eosinophils Absolute: 72 cells/uL (ref 15–500)
Eosinophils Relative: 1.1 %
HCT: 47.6 % (ref 38.5–50.0)
Hemoglobin: 15.7 g/dL (ref 13.2–17.1)
Lymphs Abs: 1372 cells/uL (ref 850–3900)
MCH: 29.3 pg (ref 27.0–33.0)
MCHC: 33 g/dL (ref 32.0–36.0)
MCV: 89 fL (ref 80.0–100.0)
MPV: 9.7 fL (ref 7.5–12.5)
Monocytes Relative: 7.2 %
Neutro Abs: 4550 cells/uL (ref 1500–7800)
Neutrophils Relative %: 70 %
Platelets: 327 10*3/uL (ref 140–400)
RBC: 5.35 10*6/uL (ref 4.20–5.80)
RDW: 12.2 % (ref 11.0–15.0)
Total Lymphocyte: 21.1 %
WBC: 6.5 10*3/uL (ref 3.8–10.8)

## 2019-06-17 LAB — COMPREHENSIVE METABOLIC PANEL
AG Ratio: 2 (calc) (ref 1.0–2.5)
ALT: 32 U/L (ref 9–46)
AST: 22 U/L (ref 10–40)
Albumin: 4.3 g/dL (ref 3.6–5.1)
Alkaline phosphatase (APISO): 91 U/L (ref 36–130)
BUN: 13 mg/dL (ref 7–25)
CO2: 25 mmol/L (ref 20–32)
Calcium: 9.5 mg/dL (ref 8.6–10.3)
Chloride: 103 mmol/L (ref 98–110)
Creat: 0.96 mg/dL (ref 0.60–1.35)
Globulin: 2.1 g/dL (calc) (ref 1.9–3.7)
Glucose, Bld: 166 mg/dL — ABNORMAL HIGH (ref 65–99)
Potassium: 4.8 mmol/L (ref 3.5–5.3)
Sodium: 139 mmol/L (ref 135–146)
Total Bilirubin: 0.6 mg/dL (ref 0.2–1.2)
Total Protein: 6.4 g/dL (ref 6.1–8.1)

## 2019-06-17 LAB — LIPID PANEL
Cholesterol: 130 mg/dL (ref <200)
HDL: 35 mg/dL — ABNORMAL LOW (ref >=40)
LDL Cholesterol (Calc): 78 mg/dL (calc)
Non-HDL Cholesterol (Calc): 95 mg/dL (calc) (ref <130)
Total CHOL/HDL Ratio: 3.7 (calc) (ref <5.0)
Triglycerides: 90 mg/dL (ref <150)

## 2019-06-20 ENCOUNTER — Encounter: Payer: Self-pay | Admitting: *Deleted

## 2019-11-08 ENCOUNTER — Other Ambulatory Visit: Payer: Self-pay | Admitting: Family Medicine

## 2019-11-08 NOTE — Telephone Encounter (Signed)
Ok to refill 

## 2020-01-09 ENCOUNTER — Other Ambulatory Visit: Payer: Self-pay | Admitting: Family Medicine

## 2020-01-19 ENCOUNTER — Other Ambulatory Visit: Payer: Self-pay | Admitting: Family Medicine

## 2020-01-19 ENCOUNTER — Other Ambulatory Visit: Payer: Self-pay

## 2020-01-19 ENCOUNTER — Ambulatory Visit (INDEPENDENT_AMBULATORY_CARE_PROVIDER_SITE_OTHER): Payer: 59 | Admitting: Family Medicine

## 2020-01-19 ENCOUNTER — Encounter: Payer: Self-pay | Admitting: Family Medicine

## 2020-01-19 ENCOUNTER — Telehealth: Payer: Self-pay | Admitting: *Deleted

## 2020-01-19 VITALS — BP 128/76 | HR 74 | Temp 98.2°F | Resp 14 | Ht 72.5 in | Wt 229.0 lb

## 2020-01-19 DIAGNOSIS — K580 Irritable bowel syndrome with diarrhea: Secondary | ICD-10-CM | POA: Diagnosis not present

## 2020-01-19 DIAGNOSIS — E1169 Type 2 diabetes mellitus with other specified complication: Secondary | ICD-10-CM | POA: Diagnosis not present

## 2020-01-19 DIAGNOSIS — E109 Type 1 diabetes mellitus without complications: Secondary | ICD-10-CM | POA: Diagnosis not present

## 2020-01-19 DIAGNOSIS — N529 Male erectile dysfunction, unspecified: Secondary | ICD-10-CM | POA: Diagnosis not present

## 2020-01-19 DIAGNOSIS — E785 Hyperlipidemia, unspecified: Secondary | ICD-10-CM

## 2020-01-19 DIAGNOSIS — Z6830 Body mass index (BMI) 30.0-30.9, adult: Secondary | ICD-10-CM

## 2020-01-19 DIAGNOSIS — E6609 Other obesity due to excess calories: Secondary | ICD-10-CM

## 2020-01-19 MED ORDER — TADALAFIL 20 MG PO TABS
20.0000 mg | ORAL_TABLET | Freq: Every day | ORAL | 2 refills | Status: DC | PRN
Start: 2020-01-19 — End: 2021-04-17

## 2020-01-19 NOTE — Progress Notes (Signed)
Subjective:    Patient ID: Thomas Ramsey, male    DOB: 14-Jul-1981, 39 y.o.   MRN: [id number]  Patient presents for Follow-up (is fasting) and GI Issues (thinks he has IBS- can't eat pork, dairy)   Patient here to follow-up chronic medical problems.  Medications were changed  Type 1 DM- followed by endocrinology has insulin pump, he has Tuesday   Hyperlipidemia- on lipitor 80mg  at bedtime   HTN/renal protection taking lisinopril 5mg  once a day   Weight down 7lbs since visit in Sept   When he was younger had IBS symptoms. When he eats he has severe cramping. He cant eat pork or dairy, oils without discomfort   He does get diarrhea with cramping in the lower abdomen, no blood in stool when he eats these things that will last few a days and then normal   Symptoms worse the past the past month He did get tested for COVID due to change in his systems   He never had full testing as a child   He is able to eat wheat products without any difficulty   He does take prilosec as needed - typically if he knows a trigger  He denies any nausea or vomiting   ED viagra did not work   Review Of Systems:  GEN- denies fatigue, fever, weight loss,weakness, recent illness HEENT- denies eye drainage, change in vision, nasal discharge, CVS- denies chest pain, palpitations RESP- denies SOB, cough, wheeze ABD- denies N/V, change in stools, abd pain GU- denies dysuria, hematuria, dribbling, incontinence MSK- denies joint pain, muscle aches, injury Neuro- denies headache, dizziness, syncope, seizure activity       Objective:    BP 128/76   Pulse 74   Temp 98.2 F (36.8 C) (Temporal)   Resp 14   Ht 6' 0.5" (1.842 m)   Wt 229 lb (103.9 kg)   SpO2 97%   BMI 30.63 kg/m  GEN- NAD, alert and oriented x3 HEENT- PERRL, EOMI, non injected sclera, pink conjunctiva, MMM, oropharynx clear Neck- Supple, no thyromegaly CVS- RRR, no murmur RESP-CTAB ABD-NABS,soft,NT,ND EXT- No edema Pulses-  Radial, DP- 2+        Assessment & Plan:      Problem List Items Addressed This Visit      Unprioritized   Erectile dysfunction-change to Cialis   Hyperlipidemia associated with type 2 diabetes mellitus (HCC)    Check lipid panel.  Drug Lipitor.  Goal is LDL closer to 70.      Relevant Medications   insulin lispro (HUMALOG) 100 UNIT/ML injection   Other Relevant Orders   Lipid panel   Obesity   Relevant Medications   insulin lispro (HUMALOG) 100 UNIT/ML injection   Type 1 diabetes mellitus (HCC) - Primary    Diabetes managed by endocrinology he has a follow-up visit next Tuesday.      Relevant Medications   insulin lispro (HUMALOG) 100 UNIT/ML injection    Other Visit Diagnoses    Irritable bowel syndrome with diarrhea       IBS-like symptoms.  I do not think that he is gluten intolerant.  Benign abdominal exam He is avoiding foods that trigger his symptoms.  Occasional PPI use.  Recommend him trying probiotic to see if this resets the gut flora.  If no improvement then will send him to gastroenterology for scope.  He is not giving typical signs of gastroparesis.   Relevant Orders   CBC with Differential/Platelet   Comprehensive metabolic panel  Lipase      Note: This dictation was prepared with Dragon dictation along with smaller phrase technology. Any transcriptional errors that result from this process are unintentional.

## 2020-01-19 NOTE — Telephone Encounter (Signed)
Received call from patient.   Reports that Cialis is not covered by insurance.   Requested to continue Sildenafil as it is cheaper.   MD please advise.

## 2020-01-19 NOTE — Assessment & Plan Note (Signed)
Check lipid panel.  Drug Lipitor.  Goal is LDL closer to 70.

## 2020-01-19 NOTE — Patient Instructions (Addendum)
F/U 6 months for Physical Try probiotics such as Restora, Align, Florastor etc ,take for 3 months

## 2020-01-19 NOTE — Assessment & Plan Note (Signed)
Diabetes managed by endocrinology he has a follow-up visit next Tuesday.

## 2020-01-19 NOTE — Assessment & Plan Note (Signed)
Will change to Cialis

## 2020-01-20 LAB — CBC WITH DIFFERENTIAL/PLATELET
Absolute Monocytes: 543 cells/uL (ref 200–950)
Basophils Absolute: 60 cells/uL (ref 0–200)
Basophils Relative: 0.9 %
Eosinophils Absolute: 101 cells/uL (ref 15–500)
Eosinophils Relative: 1.5 %
HCT: 47.3 % (ref 38.5–50.0)
Hemoglobin: 15.7 g/dL (ref 13.2–17.1)
Lymphs Abs: 1347 cells/uL (ref 850–3900)
MCH: 30 pg (ref 27.0–33.0)
MCHC: 33.2 g/dL (ref 32.0–36.0)
MCV: 90.4 fL (ref 80.0–100.0)
MPV: 9.9 fL (ref 7.5–12.5)
Monocytes Relative: 8.1 %
Neutro Abs: 4650 cells/uL (ref 1500–7800)
Neutrophils Relative %: 69.4 %
Platelets: 291 10*3/uL (ref 140–400)
RBC: 5.23 10*6/uL (ref 4.20–5.80)
RDW: 11.8 % (ref 11.0–15.0)
Total Lymphocyte: 20.1 %
WBC: 6.7 10*3/uL (ref 3.8–10.8)

## 2020-01-20 LAB — LIPID PANEL
Cholesterol: 120 mg/dL (ref <200)
HDL: 40 mg/dL (ref >=40)
LDL Cholesterol (Calc): 66 mg/dL (calc)
Non-HDL Cholesterol (Calc): 80 mg/dL (calc) (ref <130)
Total CHOL/HDL Ratio: 3 (calc) (ref <5.0)
Triglycerides: 64 mg/dL (ref <150)

## 2020-01-20 LAB — LIPASE: Lipase: 20 U/L (ref 7–60)

## 2020-01-20 LAB — COMPREHENSIVE METABOLIC PANEL
AG Ratio: 2.1 (calc) (ref 1.0–2.5)
ALT: 27 U/L (ref 9–46)
AST: 18 U/L (ref 10–40)
Albumin: 4.5 g/dL (ref 3.6–5.1)
Alkaline phosphatase (APISO): 80 U/L (ref 36–130)
BUN: 15 mg/dL (ref 7–25)
CO2: 27 mmol/L (ref 20–32)
Calcium: 9.6 mg/dL (ref 8.6–10.3)
Chloride: 101 mmol/L (ref 98–110)
Creat: 0.98 mg/dL (ref 0.60–1.35)
Globulin: 2.1 g/dL (calc) (ref 1.9–3.7)
Glucose, Bld: 367 mg/dL — ABNORMAL HIGH (ref 65–99)
Potassium: 5.6 mmol/L — ABNORMAL HIGH (ref 3.5–5.3)
Sodium: 137 mmol/L (ref 135–146)
Total Bilirubin: 0.6 mg/dL (ref 0.2–1.2)
Total Protein: 6.6 g/dL (ref 6.1–8.1)

## 2020-01-21 MED ORDER — SILDENAFIL CITRATE 100 MG PO TABS
100.0000 mg | ORAL_TABLET | Freq: Every day | ORAL | 2 refills | Status: DC | PRN
Start: 2020-01-21 — End: 2021-04-17

## 2020-01-22 ENCOUNTER — Other Ambulatory Visit: Payer: Self-pay | Admitting: *Deleted

## 2020-01-22 DIAGNOSIS — E875 Hyperkalemia: Secondary | ICD-10-CM

## 2020-01-29 ENCOUNTER — Other Ambulatory Visit: Payer: Self-pay

## 2020-03-10 ENCOUNTER — Other Ambulatory Visit: Payer: Self-pay | Admitting: Family Medicine

## 2020-07-22 ENCOUNTER — Encounter: Payer: 59 | Admitting: Family Medicine

## 2020-08-18 ENCOUNTER — Other Ambulatory Visit: Payer: Self-pay | Admitting: Family Medicine

## 2020-10-18 ENCOUNTER — Telehealth: Payer: Self-pay | Admitting: *Deleted

## 2020-10-18 NOTE — Telephone Encounter (Signed)
Received call from patient [phone]~ telephone.   Reports that he has tested COVID + on 10/10/2020. States that he began having Sx on 10/09/2020: cough, slight fever, body aches, nasal congestion.   States that he has mild nasal congestion, cough and sinus pressure at this time.  States that he is feeling much improved from last week.   Reports that he requires a negative PCR test to be able to return to work. States that he was re-tested on 10/16/2020, and test is still noted positive.   Inquired as to when he should take another test. Please advise.

## 2020-10-18 NOTE — Telephone Encounter (Signed)
  Call pt, we dont recommend retesting as the COVID test can stay positive for weeks and pt not be symptomatic Once he has completed his quarentine as long as symptoms improved, no fever > 48 hours without fever reducer He can return to work wearing a mask.

## 2020-10-18 NOTE — Telephone Encounter (Signed)
Called pt, left message for pt regarding notes from doctor.

## 2021-01-21 LAB — HM DIABETES FOOT EXAM

## 2021-01-21 LAB — HEMOGLOBIN A1C: Hemoglobin A1C: 8.7

## 2021-01-28 LAB — HM DIABETES EYE EXAM

## 2021-03-05 ENCOUNTER — Telehealth: Payer: Self-pay | Admitting: Family Medicine

## 2021-03-05 MED ORDER — ATORVASTATIN CALCIUM 80 MG PO TABS: 80.0000 mg | ORAL_TABLET | Freq: Every day | ORAL | 0 refills | Status: DC

## 2021-03-05 NOTE — Telephone Encounter (Signed)
Pt called asking to get a refill of atorvastatin (LIPITOR) 80 MG tablet  sent to pharmacy. Please call   Cb#: [phone]

## 2021-03-05 NOTE — Telephone Encounter (Signed)
Medication filled x1 with no refills.   Patient must establish with new PCP.  

## 2021-04-17 ENCOUNTER — Encounter: Payer: Self-pay | Admitting: Nurse Practitioner

## 2021-04-17 ENCOUNTER — Ambulatory Visit: Payer: 59 | Admitting: Nurse Practitioner

## 2021-04-17 ENCOUNTER — Other Ambulatory Visit: Payer: Self-pay

## 2021-04-17 VITALS — BP 136/84 | HR 77 | Temp 98.4°F | Ht 72.5 in | Wt 234.0 lb

## 2021-04-17 DIAGNOSIS — I10 Essential (primary) hypertension: Secondary | ICD-10-CM

## 2021-04-17 DIAGNOSIS — N529 Male erectile dysfunction, unspecified: Secondary | ICD-10-CM

## 2021-04-17 DIAGNOSIS — E1169 Type 2 diabetes mellitus with other specified complication: Secondary | ICD-10-CM | POA: Diagnosis not present

## 2021-04-17 DIAGNOSIS — E103291 Type 1 diabetes mellitus with mild nonproliferative diabetic retinopathy without macular edema, right eye: Secondary | ICD-10-CM

## 2021-04-17 DIAGNOSIS — Z9641 Presence of insulin pump (external) (internal): Secondary | ICD-10-CM

## 2021-04-17 DIAGNOSIS — Z114 Encounter for screening for human immunodeficiency virus [HIV]: Secondary | ICD-10-CM | POA: Diagnosis not present

## 2021-04-17 DIAGNOSIS — E785 Hyperlipidemia, unspecified: Secondary | ICD-10-CM

## 2021-04-17 DIAGNOSIS — Z1159 Encounter for screening for other viral diseases: Secondary | ICD-10-CM

## 2021-04-17 DIAGNOSIS — E103293 Type 1 diabetes mellitus with mild nonproliferative diabetic retinopathy without macular edema, bilateral: Secondary | ICD-10-CM

## 2021-04-17 MED ORDER — LISINOPRIL 5 MG PO TABS: 5.0000 mg | ORAL_TABLET | Freq: Every day | ORAL | 1 refills | Status: DC

## 2021-04-17 MED ORDER — TADALAFIL 2.5 MG PO TABS: 2.5000 mg | ORAL_TABLET | Freq: Every day | ORAL | 0 refills | Status: DC | PRN

## 2021-04-17 MED ORDER — ATORVASTATIN CALCIUM 80 MG PO TABS: 80.0000 mg | ORAL_TABLET | Freq: Every day | ORAL | 1 refills | Status: DC

## 2021-04-17 NOTE — Progress Notes (Signed)
Subjective:    Patient ID: Thomas Ramsey, male    DOB: 1981/02/11, 40 y.o.   MRN: [id number]  HPI: Thomas Ramsey is a 40 y.o. male presenting for follow up.  Chief Complaint  Patient presents with   Follow-up    Cholesterol follow up, no medical concerns. Told by diabetes doctor not to take lisinopril. Pt is fasting   TYPE I DIABETES Follows with Endocrinology; last visit in May 2022 showed HgbA1c 8.7%.  Wears Dexcom and has insulin pump.  Has cut down on juice, does not eat late.  Stays active; cardio mostly.  Reports previous endocrinologist retired recently and is requesting an ultrasound today. Hypoglycemic episodes:no; at times but has insulin pump and dexcom Polydipsia/polyuria: no Visual disturbance: no Chest pain: no Paresthesias: no Glucose Monitoring: yes; Dexcom  Taking Insulin?: yes; insulin pump managed by Endoc.rinologist Blood Pressure Monitoring: rarely Normal BP: 135/80s  Retinal Examination: Up to Date Foot Exam: Up to Date Diabetic Education: Completed Pneumovax: Up to Date Influenza: Not up to Date Aspirin: no  HYPERTENSION / HYPERLIPIDEMIA Previously took lisinopril 5 mg for kidney protection.  Reports endocrinologist stopped this "to give his kidneys a break ".  Is currently on high intensity statin-atorvastatin 80 mg daily.  Tolerating this well. Satisfied with current treatment? yes Duration of hypertension: chronic BP monitoring frequency: random BP range: 130s/80s BP medication side effects: no Duration of hyperlipidemia: chronic Cholesterol medication side effects: no Aspirin: no Recent stressors:  no; however patient does have a stressful job as he works for [employer] at the [employer]. Recurrent headaches: no Visual changes: no Palpitations: no Dyspnea: no Chest pains: no Lower extremity edema: no Dizzy/lightheaded: no  ERECTILE DYSFUNCTION Erectile Dysfunction status: uncontrolled Current treatment:  sildenafil 100 mg, then tadalafil 20 mg prn.  Reports neither helped with as needed use. Satisfied with current treatment: no Side effects: no Duration:  chronic Struggles with maintaining erection: yes Fevers: no Hematuria: no  Waking up at night to urinate: no Urinary incontinence: no Chest pain: no Shortness of breath:  no  No Known Allergies  Outpatient Encounter Medications as of 04/17/2021  Medication Sig Note   Continuous Blood Gluc Receiver (DEXCOM G6 RECEIVER) DEVI USE AS DIRECTED FOR CONTINUOUS GLUCOSE MONITORING    Continuous Blood Gluc Sensor (DEXCOM G6 SENSOR) MISC SMARTSIG:1 Topical Every 10 Days    Continuous Blood Gluc Transmit (DEXCOM G6 TRANSMITTER) MISC USE AS DIRECTED FOR CONTINUOUS GLUCOSE MONITORING. REUSE TRAINSMITTER FOR 90 DAYS THEN DISCARD AND REPALCE    insulin lispro (HUMALOG) 100 UNIT/ML injection INJECT UP TO 100 UNITS DAILY VIA INSULIN PUMP    [DISCONTINUED] atorvastatin (LIPITOR) 80 MG tablet Take 1 tablet (80 mg total) by mouth daily.    [DISCONTINUED] sildenafil (VIAGRA) 100 MG tablet Take 1 tablet (100 mg total) by mouth daily as needed for erectile dysfunction.    [DISCONTINUED] tadalafil (CIALIS) 20 MG tablet Take 1 tablet (20 mg total) by mouth daily as needed for erectile dysfunction.    atorvastatin (LIPITOR) 80 MG tablet Take 1 tablet (80 mg total) by mouth daily.    lisinopril (ZESTRIL) 5 MG tablet Take 1 tablet (5 mg total) by mouth daily.    tadalafil 2.5 MG TABS Take 1 tablet (2.5 mg total) by mouth daily as needed for erectile dysfunction.    [DISCONTINUED] lisinopril (ZESTRIL) 5 MG tablet Take 1 tablet by mouth once daily (Patient not taking: Reported on 04/17/2021) 04/17/2021: Told bu dm doctor not to take  due to kidneys   No facility-administered encounter medications on file as of 04/17/2021.    Patient Active Problem List   Diagnosis Date Noted   Eczema, dyshidrotic 06/16/2019   Hyperlipidemia associated with type 2 diabetes mellitus  (HCC) 12/14/2018   Obesity 12/14/2018   Hypertensive retinopathy of both eyes 02/15/2018   Nuclear sclerotic cataract of both eyes 02/15/2018   Chronic heel pain, left 12/21/2017   Plantar fasciitis 12/21/2017   Erectile dysfunction 12/21/2017   Mild nonproliferative diabetic retinopathy (HCC) 01/25/2014   IBS (irritable bowel syndrome) 10/04/2013   Diabetes mellitus type 1 (HCC) 08/29/2013   Insulin pump status 08/29/2013   Essential (primary) hypertension 08/29/2013    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 in thumbs or great toes   Type I (juvenile type) diabetes mellitus without mention of complication, not stated as uncontrolled     Relevant past medical, surgical, family and social history reviewed and updated as indicated. Interim medical history since our last visit reviewed.  Review of Systems Per HPI unless specifically indicated above     Objective:    BP 136/84   Pulse 77   Temp 98.4 F (36.9 C)   Ht 6' 0.5" (1.842 m)   Wt 234 lb (106.1 kg)   SpO2 97%   BMI 31.30 kg/m   Wt Readings from Last 3 Encounters:  04/17/21 234 lb (106.1 kg)  01/19/20 229 lb (103.9 kg)  06/16/19 236 lb (107 kg)    Physical Exam Vitals and nursing note reviewed.  Constitutional:      General: He is not in acute distress.    Appearance: Normal appearance. He is not toxic-appearing.  HENT:     Head: Normocephalic and atraumatic.     Mouth/Throat:     Mouth: Mucous membranes are moist.     Pharynx: Oropharynx is clear. No oropharyngeal exudate or posterior oropharyngeal erythema.  Eyes:     General: No scleral icterus.       Right eye: No discharge.        Left eye: No discharge.     Extraocular Movements: Extraocular movements intact.     Pupils: Pupils are equal, round, and reactive to light.  Neck:     Vascular: No carotid bruit.  Cardiovascular:     Rate and Rhythm: Normal rate and regular rhythm.      Heart sounds: Normal heart sounds. No murmur heard. Pulmonary:     Effort: Pulmonary effort is normal. No respiratory distress.     Breath sounds: Normal breath sounds. No wheezing, rhonchi or rales.  Abdominal:     General: Abdomen is flat. Bowel sounds are normal. There is no distension.     Palpations: Abdomen is soft.     Tenderness: There is no abdominal tenderness. There is no right CVA tenderness or left CVA tenderness.  Musculoskeletal:        General: Normal range of motion.     Cervical back: Normal range of motion.     Right lower leg: No edema.     Left lower leg: No edema.  Skin:    General: Skin is warm and dry.     Capillary Refill: Capillary refill takes less than 2 seconds.     Coloration: Skin is not jaundiced or pale.     Findings: No erythema.  Neurological:     Mental Status: He is alert and oriented to  person, place, and time.     Motor: No weakness.     Gait: Gait normal.  Psychiatric:        Mood and Affect: Mood normal.        Behavior: Behavior normal.        Thought Content: Thought content normal.        Judgment: Judgment normal.      Assessment & Plan:   Problem List Items Addressed This Visit       Cardiovascular and Mediastinum   Essential (primary) hypertension    Chronic, slightly elevated above goal today in clinic.  Given history of Type I diabetes, will resume lisinopril 5 mg daily.  Encouraged checking blood pressure at home and notify us with readings consistently greater than 130/80 after 2 weeks of taking lisinopril.  Follow up in 1 month.       Relevant Medications   lisinopril (ZESTRIL) 5 MG tablet   atorvastatin (LIPITOR) 80 MG tablet   tadalafil 2.5 MG TABS     Endocrine   Mild nonproliferative diabetic retinopathy (HCC)    Chronic, follows with ophthalmology.  Continue collaboration.  Last eye examination was earlier this year.       Relevant Medications   lisinopril (ZESTRIL) 5 MG tablet   atorvastatin (LIPITOR) 80 MG  tablet   Hyperlipidemia associated with type 2 diabetes mellitus (HCC)    Chronic, ongoing.  Will check lipid panel today - patient is fasting.  Goal LDL is less than 70.  He is on high intensity statin and we will need to add in Zetia if LDL remains above goal.  Follow up 3-6 months pending labs.       Relevant Medications   lisinopril (ZESTRIL) 5 MG tablet   atorvastatin (LIPITOR) 80 MG tablet   Other Relevant Orders   Lipid panel (Completed)   Diabetes mellitus type 1 (HCC) - Primary    Chronic, diagnosed in 2001.  Has Dexcom and insulin pump and follows with endocrinology.  However, his endocrinologist recently retired and he is looking for a new one. Will place referral today.  He follows with ophthalmology and podiatry.  Checks feet daily.  Due for urine microalbumin, will defer today.  Last A1c in May 2022.  On high intensity statin, goal LDL is less than 70.  We are resuming lisinopril 5 mg daily today for both blood pressure and kidney protection.  Plan to follow up in 1 month to recheck kidney function.       Relevant Medications   lisinopril (ZESTRIL) 5 MG tablet   atorvastatin (LIPITOR) 80 MG tablet   Other Relevant Orders   Lipid panel (Completed)   COMPLETE METABOLIC PANEL WITH GFR (Completed)   CBC with Differential/Platelet (Completed)   Ambulatory referral to Endocrinology     Other   Insulin pump status   Erectile dysfunction    Without benefit from as needed medication, will try daily low dose Cilalis.  No red flags or lower urinary tract symptoms.  Can consider referral to Urology if no benefit from Cialis.        Relevant Medications   tadalafil 2.5 MG TABS   Other Visit Diagnoses     Screening for HIV (human immunodeficiency virus)       Relevant Orders   HIV Antibody (routine testing w rflx) (Completed)   Need for hepatitis C screening test       Relevant Orders   Hepatitis C antibody (Completed)  Follow up plan: Return in about 6 months  (around 10/18/2021) for 6 months HTN/HLD/DM.

## 2021-04-18 LAB — COMPLETE METABOLIC PANEL WITH GFR
AG Ratio: 1.9 (calc) (ref 1.0–2.5)
ALT: 27 U/L (ref 9–46)
AST: 19 U/L (ref 10–40)
Albumin: 4.5 g/dL (ref 3.6–5.1)
Alkaline phosphatase (APISO): 87 U/L (ref 36–130)
BUN: 15 mg/dL (ref 7–25)
CO2: 27 mmol/L (ref 20–32)
Calcium: 9.3 mg/dL (ref 8.6–10.3)
Chloride: 103 mmol/L (ref 98–110)
Creat: 0.86 mg/dL (ref 0.60–1.29)
Globulin: 2.4 g/dL (calc) (ref 1.9–3.7)
Glucose, Bld: 126 mg/dL — ABNORMAL HIGH (ref 65–99)
Potassium: 4.7 mmol/L (ref 3.5–5.3)
Sodium: 141 mmol/L (ref 135–146)
Total Bilirubin: 0.5 mg/dL (ref 0.2–1.2)
Total Protein: 6.9 g/dL (ref 6.1–8.1)
eGFR: 112 mL/min/{1.73_m2} (ref >=60)

## 2021-04-18 LAB — LIPID PANEL
Cholesterol: 147 mg/dL (ref <200)
HDL: 39 mg/dL — ABNORMAL LOW (ref >=40)
LDL Cholesterol (Calc): 95 mg/dL (calc)
Non-HDL Cholesterol (Calc): 108 mg/dL (calc) (ref <130)
Total CHOL/HDL Ratio: 3.8 (calc) (ref <5.0)
Triglycerides: 43 mg/dL (ref <150)

## 2021-04-18 LAB — CBC WITH DIFFERENTIAL/PLATELET
Absolute Monocytes: 405 cells/uL (ref 200–950)
Basophils Absolute: 40 cells/uL (ref 0–200)
Basophils Relative: 0.7 %
Eosinophils Absolute: 51 cells/uL (ref 15–500)
Eosinophils Relative: 0.9 %
HCT: 47.7 % (ref 38.5–50.0)
Hemoglobin: 15.8 g/dL (ref 13.2–17.1)
Lymphs Abs: 1362 cells/uL (ref 850–3900)
MCH: 29.3 pg (ref 27.0–33.0)
MCHC: 33.1 g/dL (ref 32.0–36.0)
MCV: 88.5 fL (ref 80.0–100.0)
MPV: 9.5 fL (ref 7.5–12.5)
Monocytes Relative: 7.1 %
Neutro Abs: 3842 cells/uL (ref 1500–7800)
Neutrophils Relative %: 67.4 %
Platelets: 291 10*3/uL (ref 140–400)
RBC: 5.39 10*6/uL (ref 4.20–5.80)
RDW: 12.3 % (ref 11.0–15.0)
Total Lymphocyte: 23.9 %
WBC: 5.7 10*3/uL (ref 3.8–10.8)

## 2021-04-18 LAB — HEPATITIS C ANTIBODY
Hepatitis C Ab: NONREACTIVE
SIGNAL TO CUT-OFF: 0.01 (ref <1.00)

## 2021-04-18 LAB — HIV ANTIBODY (ROUTINE TESTING W REFLEX): HIV 1&2 Ab, 4th Generation: NONREACTIVE

## 2021-04-19 NOTE — Assessment & Plan Note (Signed)
Chronic, ongoing.  Will check lipid panel today - patient is fasting.  Goal LDL is less than 70.  He is on high intensity statin and we will need to add in Zetia if LDL remains above goal.  Follow up 3-6 months pending labs.

## 2021-04-19 NOTE — Assessment & Plan Note (Signed)
Chronic, slightly elevated above goal today in clinic.  Given history of Type I diabetes, will resume lisinopril 5 mg daily.  Encouraged checking blood pressure at home and notify us with readings consistently greater than 130/80 after 2 weeks of taking lisinopril.  Follow up in 1 month.

## 2021-04-19 NOTE — Assessment & Plan Note (Signed)
Chronic, diagnosed in 2001.  Has Dexcom and insulin pump and follows with endocrinology.  However, his endocrinologist recently retired and he is looking for a new one. Will place referral today.  He follows with ophthalmology and podiatry.  Checks feet daily.  Due for urine microalbumin, will defer today.  Last A1c in May 2022.  On high intensity statin, goal LDL is less than 70.  We are resuming lisinopril 5 mg daily today for both blood pressure and kidney protection.  Plan to follow up in 1 month to recheck kidney function.

## 2021-04-19 NOTE — Assessment & Plan Note (Signed)
Without benefit from as needed medication, will try daily low dose Cilalis.  No red flags or lower urinary tract symptoms.  Can consider referral to Urology if no benefit from Cialis.

## 2021-04-19 NOTE — Assessment & Plan Note (Signed)
Chronic, follows with ophthalmology.  Continue collaboration.  Last eye examination was earlier this year.

## 2021-05-29 ENCOUNTER — Other Ambulatory Visit: Payer: Self-pay | Admitting: Nurse Practitioner

## 2021-05-29 DIAGNOSIS — N529 Male erectile dysfunction, unspecified: Secondary | ICD-10-CM

## 2021-05-30 NOTE — Telephone Encounter (Signed)
Ok to refill 

## 2021-06-24 ENCOUNTER — Other Ambulatory Visit: Payer: Self-pay

## 2021-06-24 ENCOUNTER — Encounter: Payer: Self-pay | Admitting: Endocrinology

## 2021-06-24 ENCOUNTER — Ambulatory Visit: Payer: 59 | Admitting: Endocrinology

## 2021-06-24 VITALS — BP 110/56 | HR 72 | Ht 72.5 in | Wt 235.8 lb

## 2021-06-24 DIAGNOSIS — E103293 Type 1 diabetes mellitus with mild nonproliferative diabetic retinopathy without macular edema, bilateral: Secondary | ICD-10-CM | POA: Diagnosis not present

## 2021-06-24 LAB — POCT GLYCOSYLATED HEMOGLOBIN (HGB A1C): Hemoglobin A1C: 7.6 % — AB (ref 4.0–5.6)

## 2021-06-24 NOTE — Patient Instructions (Addendum)
good diet and exercise significantly improve the control of your diabetes.  please let me know if you wish to be referred to a dietician.  high blood sugar is very risky to your health.  you should see an eye doctor and dentist every year.  It is very important to get all recommended vaccinations.  Controlling your blood pressure and cholesterol drastically reduces the damage diabetes does to your body.  Those who smoke should quit.  Please discuss these with your doctor.  Please takes these pump settings:  basal rate of 1.8 units/hr (when not in control IQ mode).   bolus of 1 unit/2 grams carbohydrate.   correction bolus (which some people call "sensitivity," or "insulin sensitivity ratio," or just "isr") of 1 unit for each 23 by which your glucose exceeds 100.   Please come back for a follow-up appointment in January.

## 2021-06-24 NOTE — Progress Notes (Signed)
Subjective:    Patient ID: Thomas Ramsey, male    DOB: 11/14/1980, 40 y.o.   MRN: [id number]  HPI pt is referred by Cathlean Marseilles, NP, for diabetes.  Pt states DM was dx'ed in 2001; it is complicated by DR; he has been on insulin since dx; pt says his diet and exercise are good; he has never had pancreatitis, pancreatic surgery, severe hypoglycemia.  He has had DKA once (2010, in [place]).  He has been on pump rx since 2007T(now Tandem pump and Dexcom CGM).  He seldom has hypoglycemia, and these episodes are mild.  Pt says he is very good at counting CHO.  He eats meals at 7AM, 12N, and 8PM He uses these pump settings: basal rates varying from 1.65-1.9 units/hr bolus of 1 unit/2.5-3.5grams carbohydrate correction bolus (which some people call "sensitivity," or "insulin sensitivity ratio," or just "isr") of 1 unit for each by which your glucose exceeds 100.   I reviewed continuous glucose monitor data.  Glucose varies from 60-400.  It is in general highest at 10AM, 2PM, and 9PM.  It decreases overnight TDD is 87 units (57% basal, 21% bolus, 6% correction bolus, and 16% control IQ bolus).  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 toes   Type I (juvenile type) diabetes mellitus without mention of complication, not stated as uncontrolled     Past Surgical History:  Procedure Laterality Date   FINGER SURGERY  1984   Reattached Index finger (right hand)    TONSILLECTOMY  1992   TYMPANOSTOMY TUBE PLACEMENT  1984    Social History   Socioeconomic History   Marital status: Single    Spouse name: Not on file   Number of children: Not on file   Years of education: Not on file   Highest education level: Not on file  Occupational History    Employer: [employer]  Tobacco Use   Smoking status: Never   Smokeless tobacco: Never  Vaping Use   Vaping Use:  Never used  Substance and Sexual Activity   Alcohol use: No   Drug use: No   Sexual activity: Yes  Other Topics Concern   Not on file  Social History Narrative   Not on file   Social Determinants of Health   Financial Resource Strain: Not on file  Food Insecurity: Not on file  Transportation Needs: Not on file  Physical Activity: Not on file  Stress: Not on file  Social Connections: Not on file  Intimate Partner Violence: Not on file    Current Outpatient Medications on File Prior to Visit  Medication Sig Dispense Refill   atorvastatin (LIPITOR) 80 MG tablet Take 1 tablet (80 mg total) by mouth daily. 90 tablet 1   Continuous Blood Gluc Receiver (DEXCOM G6 RECEIVER) DEVI USE AS DIRECTED FOR CONTINUOUS GLUCOSE MONITORING     Continuous Blood Gluc Sensor (DEXCOM G6 SENSOR) MISC SMARTSIG:1 Topical Every 10 Days     Continuous Blood Gluc Transmit (DEXCOM G6 TRANSMITTER) MISC USE AS DIRECTED FOR CONTINUOUS GLUCOSE MONITORING. REUSE TRAINSMITTER FOR 90 DAYS THEN DISCARD AND REPALCE     insulin lispro (HUMALOG) 100 UNIT/ML injection INJECT UP TO 100 UNITS DAILY VIA INSULIN PUMP     lisinopril (ZESTRIL) 5 MG tablet Take 1 tablet (5 mg total) by mouth daily. 90 tablet 1  Tadalafil 2.5 MG TABS TAKE 1 TABLET BY MOUTH ONCE DAILY AS NEEDED FOR ERECTILE DYSFUNCTION 30 tablet 0   No current facility-administered medications on file prior to visit.    No Known Allergies  Family History  Problem Relation Age of Onset   Diabetes Neg Hx     BP (!) 110/56 (BP Location: Right Arm, Patient Position: Sitting, Cuff Size: Large)   Pulse 72   Ht 6' 0.5" (1.842 m)   Wt 235 lb 12.8 oz (107 kg)   SpO2 96%   BMI 31.54 kg/m   Review of Systems denies weight loss, sob, n/v, and depression.      Objective:   Physical Exam Pulses: dorsalis pedis intact bilat.   MSK: no deformity of the feet CV: no leg edema Skin:  no ulcer on the feet.  normal color and temp on the feet. Neuro: sensation is  intact to touch on the feet.    Lab Results  Component Value Date   HGBA1C 7.6 (A) 06/24/2021   I have reviewed outside records, and summarized: Pt was noted to have elevated A1c, and referred here.  Lisinopril was rx'ed.  Dyslipidemia and ED were also addressed     Assessment & Plan:  Type 1 DM:  uncontrolled  Patient Instructions  good diet and exercise significantly improve the control of your diabetes.  please let me know if you wish to be referred to a dietician.  high blood sugar is very risky to your health.  you should see an eye doctor and dentist every year.  It is very important to get all recommended vaccinations.  Controlling your blood pressure and cholesterol drastically reduces the damage diabetes does to your body.  Those who smoke should quit.  Please discuss these with your doctor.  Please takes these pump settings:  basal rate of 1.8 units/hr (when not in control IQ mode).   bolus of 1 unit/2 grams carbohydrate.   correction bolus (which some people call "sensitivity," or "insulin sensitivity ratio," or just "isr") of 1 unit for each 23 by which your glucose exceeds 100.   Please come back for a follow-up appointment in January.

## 2021-07-09 ENCOUNTER — Encounter: Payer: Self-pay | Admitting: Endocrinology

## 2021-08-07 ENCOUNTER — Other Ambulatory Visit: Payer: Self-pay | Admitting: Nurse Practitioner

## 2021-08-07 DIAGNOSIS — N529 Male erectile dysfunction, unspecified: Secondary | ICD-10-CM

## 2021-08-07 NOTE — Telephone Encounter (Signed)
Ok to refill 

## 2021-09-19 ENCOUNTER — Telehealth: Payer: Self-pay

## 2021-09-19 ENCOUNTER — Other Ambulatory Visit: Payer: Self-pay | Admitting: Family Medicine

## 2021-09-19 MED ORDER — INSULIN LISPRO 100 UNIT/ML ~~LOC~~ SOLN: SUBCUTANEOUS | 3 refills | Status: DC

## 2021-09-19 NOTE — Addendum Note (Signed)
Addended by: Grier Rocher on: 09/19/2021 04:18 PM   Modules accepted: Orders

## 2021-09-19 NOTE — Telephone Encounter (Signed)
Pt's mom came in to request a refill of insulin lispro (HUMALOG) 100 sent to pharmacy. Pt states that he is completely out of insulin. Please advise as he really needs this med to be filled today before the holiday weekend.   Cb#:[phone]

## 2021-09-24 ENCOUNTER — Ambulatory Visit: Payer: 59 | Admitting: Endocrinology

## 2021-10-02 ENCOUNTER — Other Ambulatory Visit: Payer: Self-pay | Admitting: Nurse Practitioner

## 2021-10-02 DIAGNOSIS — N529 Male erectile dysfunction, unspecified: Secondary | ICD-10-CM

## 2021-10-02 NOTE — Telephone Encounter (Signed)
Tadalafil refill request.  Last seen 04/17/2021, last filled 08/07/2021.

## 2021-10-20 ENCOUNTER — Ambulatory Visit: Payer: 59 | Admitting: Nurse Practitioner

## 2021-11-21 ENCOUNTER — Other Ambulatory Visit: Payer: Self-pay | Admitting: Nurse Practitioner

## 2021-11-21 DIAGNOSIS — N529 Male erectile dysfunction, unspecified: Secondary | ICD-10-CM

## 2021-11-25 ENCOUNTER — Encounter: Payer: Self-pay | Admitting: Endocrinology

## 2021-11-25 ENCOUNTER — Other Ambulatory Visit: Payer: Self-pay

## 2021-11-25 DIAGNOSIS — N529 Male erectile dysfunction, unspecified: Secondary | ICD-10-CM

## 2021-11-25 DIAGNOSIS — E103293 Type 1 diabetes mellitus with mild nonproliferative diabetic retinopathy without macular edema, bilateral: Secondary | ICD-10-CM

## 2021-11-25 MED ORDER — DEXCOM G6 SENSOR MISC: 3 refills | Status: DC

## 2021-11-25 NOTE — Telephone Encounter (Signed)
Refill completed.

## 2021-11-26 ENCOUNTER — Encounter: Payer: Self-pay | Admitting: Endocrinology

## 2021-11-26 ENCOUNTER — Other Ambulatory Visit: Payer: Self-pay

## 2021-11-26 ENCOUNTER — Ambulatory Visit: Payer: 59 | Admitting: Endocrinology

## 2021-11-26 VITALS — BP 120/90 | HR 87 | Ht 72.5 in | Wt 237.6 lb

## 2021-11-26 DIAGNOSIS — E103293 Type 1 diabetes mellitus with mild nonproliferative diabetic retinopathy without macular edema, bilateral: Secondary | ICD-10-CM | POA: Diagnosis not present

## 2021-11-26 LAB — POCT GLYCOSYLATED HEMOGLOBIN (HGB A1C): Hemoglobin A1C: 7.7 % — AB (ref 4.0–5.6)

## 2021-11-26 NOTE — Patient Instructions (Addendum)
good diet and exercise significantly improve the control of your diabetes.  please let me know if you wish to be referred to a dietician.  high blood sugar is very risky to your health.  you should see an eye doctor and dentist every year.  It is very important to get all recommended vaccinations.  ?Controlling your blood pressure and cholesterol drastically reduces the damage diabetes does to your body.  Those who smoke should quit.  Please discuss these with your doctor.  ?Please takes these pump settings:  ?basal rate of 1.8 units/hr (when not in control IQ mode).   ?bolus of 1 unit/2 grams carbohydrate.   ?correction bolus (which some people call "sensitivity," or "insulin sensitivity ratio," or just "isr") of 1 unit for each 23 by which your glucose exceeds 100.   ?Please come back for a follow-up appointment in 2 months.  ? ? ?

## 2021-11-26 NOTE — Progress Notes (Signed)
? ?Subjective:  ? ? Patient ID: Thomas Ramsey, male    DOB: 03/21/1981, 41 y.o.   MRN: [id number] ? ?HPI ?Pt returns for f/u of diabetes mellitus: ?DM type: 1 ?Dx'ed: 2001 ?Complications: DR ?Therapy: insulin since dx (pump rx since 2007--now Tandem pump and Dexcom CGM) ?DKA: once (2010, in [place]) ?Severe hypoglycemia: never ?Pancreatitis: never ?Pancreatic imaging: none known ?SDOH: none ?Other: He eats meals at 7AM, 12N, and 8PM. ?Interval history: He uses these pump settings: ?basal rates varying from 1.65-1.9 units/hr (when not in control IQ mode).   ?bolus of 1 unit/2.5 grams carbohydrate in the AM, and 1 unit/3 grams in the PM.   ?correction bolus (which some people call "sensitivity," or "insulin sensitivity ratio," or just "isr") of 1 unit for each 23 by which your glucose exceeds 110.   ?I reviewed continuous glucose monitor data.  Glucose varies from 40-390.  It is in general highest at 9AM, 3PM, and 7PM.  It decreases overnight.   ?TDD is 78 units (54% basal, 21% bolus, 7% correction bolus, and 12% control IQ bolus).  She is in control IQ 68% of the time.   ?He has hypoglycemia approx BIW, and these episodes are mild.   ?Past Medical History:  ?Diagnosis Date  ? Hyperlipemia   ? Other instability, unspecified joint   ? no joints in thumbs or great toes  ? Type I (juvenile type) diabetes mellitus without mention of complication, not stated as uncontrolled   ? ? ?Past Surgical History:  ?Procedure Laterality Date  ? FINGER SURGERY  1984  ? Reattached Index finger (right hand)   ? TONSILLECTOMY  1992  ? TYMPANOSTOMY TUBE PLACEMENT  1984  ? ? ?Social History  ? ?Socioeconomic History  ? Marital status: Single  ?  Spouse name: Not on file  ? Number of children: Not on file  ? Years of education: Not on file  ? Highest education level: Not on file  ?Occupational History  ?  Employer: [employer]  ?Tobacco Use  ? Smoking status: Never  ? Smokeless tobacco: Never  ?Vaping Use  ? Vaping Use: Never  used  ?Substance and Sexual Activity  ? Alcohol use: No  ? Drug use: No  ? Sexual activity: Yes  ?Other Topics Concern  ? Not on file  ?Social History Narrative  ? Not on file  ? ?Social Determinants of Health  ? ?Financial Resource Strain: Not on file  ?Food Insecurity: Not on file  ?Transportation Needs: Not on file  ?Physical Activity: Not on file  ?Stress: Not on file  ?Social Connections: Not on file  ?Intimate Partner Violence: Not on file  ? ? ?Current Outpatient Medications on File Prior to Visit  ?Medication Sig Dispense Refill  ? atorvastatin (LIPITOR) 80 MG tablet Take 1 tablet (80 mg total) by mouth daily. 90 tablet 1  ? Continuous Blood Gluc Receiver (DEXCOM G6 RECEIVER) DEVI USE AS DIRECTED FOR CONTINUOUS GLUCOSE MONITORING    ? Continuous Blood Gluc Sensor (DEXCOM G6 SENSOR) MISC SMARTSIG:1 Topical Every 10 Days 9 each 3  ? Continuous Blood Gluc Transmit (DEXCOM G6 TRANSMITTER) MISC USE AS DIRECTED FOR CONTINUOUS GLUCOSE MONITORING. REUSE TRAINSMITTER FOR 90 DAYS THEN DISCARD AND REPALCE    ? insulin lispro (HUMALOG) 100 UNIT/ML injection INJECT UP TO 100 UNITS DAILY VIA INSULIN PUMP 10 mL 3  ? lisinopril (ZESTRIL) 5 MG tablet Take 1 tablet (5 mg total) by mouth daily. 90 tablet 1  ? Tadalafil 2.5  MG TABS TAKE 1 TABLET BY MOUTH ONCE DAILY AS NEEDED FOR ERECTILE DYSFUNCTION 30 tablet 0  ? ?No current facility-administered medications on file prior to visit.  ? ? ?No Known Allergies ? ?Family History  ?Problem Relation Age of Onset  ? Diabetes Neg Hx   ? ? ?BP 120/90 (BP Location: Left Arm, Patient Position: Sitting, Cuff Size: Normal)   Pulse 87   Ht 6' 0.5" (1.842 m)   Wt 237 lb 9.6 oz (107.8 kg)   SpO2 97%   BMI 31.78 kg/m?  ? ?Review of Systems ? ?   ?Objective:  ? Physical Exam ? ? ? ?Lab Results  ?Component Value Date  ? HGBA1C 7.7 (A) 11/26/2021  ? ?   ?Assessment & Plan:  ?Type 1 DM: uncontrolled ?Hypoglycemia, due to insulin: this limits aggressiveness of glycemic control ? ? ?Patient  Instructions  ?good diet and exercise significantly improve the control of your diabetes.  please let me know if you wish to be referred to a dietician.  high blood sugar is very risky to your health.  you should see an eye doctor and dentist every year.  It is very important to get all recommended vaccinations.  ?Controlling your blood pressure and cholesterol drastically reduces the damage diabetes does to your body.  Those who smoke should quit.  Please discuss these with your doctor.  ?Please takes these pump settings:  ?basal rate of 1.8 units/hr (when not in control IQ mode).   ?bolus of 1 unit/2 grams carbohydrate.   ?correction bolus (which some people call "sensitivity," or "insulin sensitivity ratio," or just "isr") of 1 unit for each 23 by which your glucose exceeds 100.   ?Please come back for a follow-up appointment in 2 months.  ? ? ? ? ?

## 2021-12-17 ENCOUNTER — Emergency Department (HOSPITAL_BASED_OUTPATIENT_CLINIC_OR_DEPARTMENT_OTHER): Payer: No Typology Code available for payment source

## 2021-12-17 ENCOUNTER — Encounter (HOSPITAL_BASED_OUTPATIENT_CLINIC_OR_DEPARTMENT_OTHER): Payer: Self-pay

## 2021-12-17 ENCOUNTER — Other Ambulatory Visit (HOSPITAL_BASED_OUTPATIENT_CLINIC_OR_DEPARTMENT_OTHER): Payer: Self-pay

## 2021-12-17 ENCOUNTER — Other Ambulatory Visit: Payer: Self-pay

## 2021-12-17 ENCOUNTER — Emergency Department (HOSPITAL_BASED_OUTPATIENT_CLINIC_OR_DEPARTMENT_OTHER)
Admission: EM | Admit: 2021-12-17 | Discharge: 2021-12-17 | Disposition: A | Discharge status: Home / Self Care | Payer: No Typology Code available for payment source | Attending: Emergency Medicine | Admitting: Emergency Medicine

## 2021-12-17 DIAGNOSIS — Z79899 Other long term (current) drug therapy: Secondary | ICD-10-CM | POA: Diagnosis not present

## 2021-12-17 DIAGNOSIS — S022XXA Fracture of nasal bones, initial encounter for closed fracture: Secondary | ICD-10-CM | POA: Insufficient documentation

## 2021-12-17 DIAGNOSIS — Z794 Long term (current) use of insulin: Secondary | ICD-10-CM | POA: Diagnosis not present

## 2021-12-17 DIAGNOSIS — S0993XA Unspecified injury of face, initial encounter: Secondary | ICD-10-CM | POA: Diagnosis present

## 2021-12-17 MED ORDER — ACETAMINOPHEN 500 MG PO TABS
1000.0000 mg | ORAL_TABLET | Freq: Once | ORAL | Status: AC
  Administered 2021-12-17: 1000 mg via ORAL
  Filled 2021-12-17: qty 2

## 2021-12-17 MED ORDER — SALINE SPRAY 0.65 % NA SOLN
1.0000 | NASAL | 0 refills | Status: DC | PRN
  Filled 2021-12-17: qty 44, 30d supply, fill #0

## 2021-12-17 MED ORDER — OXYMETAZOLINE HCL 0.05 % NA SOLN
1.0000 | Freq: Once | NASAL | Status: AC
Start: 2021-12-17 — End: 2021-12-17
  Administered 2021-12-17: 1 via NASAL
  Filled 2021-12-17: qty 30
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
## 2021-12-21 ENCOUNTER — Other Ambulatory Visit: Payer: Self-pay | Admitting: Nurse Practitioner

## 2021-12-21 DIAGNOSIS — E1169 Type 2 diabetes mellitus with other specified complication: Secondary | ICD-10-CM

## 2021-12-29 ENCOUNTER — Other Ambulatory Visit (HOSPITAL_BASED_OUTPATIENT_CLINIC_OR_DEPARTMENT_OTHER): Payer: Self-pay

## 2022-01-13 ENCOUNTER — Ambulatory Visit: Payer: 59 | Admitting: Family Medicine

## 2022-01-14 ENCOUNTER — Other Ambulatory Visit: Payer: Self-pay

## 2022-01-14 ENCOUNTER — Encounter: Payer: Self-pay | Admitting: Endocrinology

## 2022-01-14 ENCOUNTER — Other Ambulatory Visit: Payer: Self-pay | Admitting: Family Medicine

## 2022-01-14 DIAGNOSIS — E103293 Type 1 diabetes mellitus with mild nonproliferative diabetic retinopathy without macular edema, bilateral: Secondary | ICD-10-CM

## 2022-01-14 MED ORDER — DEXCOM G6 TRANSMITTER MISC: 3 refills | Status: DC

## 2022-01-15 ENCOUNTER — Ambulatory Visit: Payer: 59 | Admitting: Family Medicine

## 2022-01-15 DIAGNOSIS — B9689 Other specified bacterial agents as the cause of diseases classified elsewhere: Secondary | ICD-10-CM | POA: Insufficient documentation

## 2022-01-15 DIAGNOSIS — J019 Acute sinusitis, unspecified: Secondary | ICD-10-CM | POA: Diagnosis not present

## 2022-01-15 DIAGNOSIS — E785 Hyperlipidemia, unspecified: Secondary | ICD-10-CM | POA: Insufficient documentation

## 2022-01-15 MED ORDER — AMOXICILLIN-POT CLAVULANATE 875-125 MG PO TABS: 1.0000 | ORAL_TABLET | Freq: Two times a day (BID) | ORAL | 0 refills | Status: DC

## 2022-01-15 NOTE — Progress Notes (Signed)
? ?Subjective:  ?Patient ID: Thomas Ramsey, male    DOB: 1980-10-15  Age: 41 y.o. MRN: [id number] ? ?CC: ?Chief Complaint  ?Patient presents with  ? Generalized Body Aches  ?  Headaches, facial pressure, had a fever- taking mucinex sinus releif and tylenol ?Negative home covid test  ? ? ?HPI: ? ?41 year old male with hypertension, type 1 diabetes, hyperlipidemia presents with the above complaints. ? ?Patient states that he has been sick since Sunday.  He has had low-grade temps, Tmax 100, body aches, headaches, sinus pressure and congestion.  He has been taking Mucinex as well as Tylenol without relief.  He has taken multiple home COVID test which have been negative.  Currently afebrile.  No known exacerbating factors.  No other complaints. ? ?Patient Active Problem List  ? Diagnosis Date Noted  ? Hyperlipidemia 01/15/2022  ? Acute sinusitis 01/15/2022  ? Eczema, dyshidrotic 06/16/2019  ? Obesity 12/14/2018  ? Hypertensive retinopathy of both eyes 02/15/2018  ? Nuclear sclerotic cataract of both eyes 02/15/2018  ? Erectile dysfunction 12/21/2017  ? Mild nonproliferative diabetic retinopathy (HCC) 01/25/2014  ? IBS (irritable bowel syndrome) 10/04/2013  ? Diabetes mellitus type 1 (HCC) 08/29/2013  ? Insulin pump status 08/29/2013  ? Essential (primary) hypertension 08/29/2013  ? ? ?Social Hx   ?Social History  ? ?Socioeconomic History  ? Marital status: Single  ?  Spouse name: Not on file  ? Number of children: Not on file  ? Years of education: Not on file  ? Highest education level: Not on file  ?Occupational History  ?  Employer: [employer]  ?Tobacco Use  ? Smoking status: Never  ? Smokeless tobacco: Never  ?Vaping Use  ? Vaping Use: Never used  ?Substance and Sexual Activity  ? Alcohol use: No  ? Drug use: No  ? Sexual activity: Yes  ?Other Topics Concern  ? Not on file  ?Social History Narrative  ? Not on file  ? ?Social Determinants of Health  ? ?Financial Resource Strain: Not on file  ?Food  Insecurity: Not on file  ?Transportation Needs: Not on file  ?Physical Activity: Not on file  ?Stress: Not on file  ?Social Connections: Not on file  ? ? ?Review of Systems ?Per HPI ? ?Objective:  ?BP 131/84   Pulse 77   Temp 98.3 ?F (36.8 ?C)   Ht 6' 0.5" (1.842 m)   Wt 240 lb (108.9 kg)   SpO2 97%   BMI 32.10 kg/m?  ? ? ?  01/15/2022  ? 10:07 AM 12/17/2021  ? 10:43 AM 12/17/2021  ? 10:27 AM  ?BP/Weight  ?Systolic BP 131 129 129  ?Diastolic BP 84 87 87  ?Wt. (Lbs) 240    ?BMI 32.1 kg/m2    ? ? ?Physical Exam ?Vitals and nursing note reviewed.  ?Constitutional:   ?   General: He is not in acute distress. ?   Appearance: Normal appearance. He is not ill-appearing.  ?HENT:  ?   Head: Normocephalic and atraumatic.  ?   Ears:  ?   Comments: Scarring noted in the right TM.  Otherwise normal.  Left TM normal. ?   Nose: Congestion present.  ?   Mouth/Throat:  ?   Pharynx: Oropharynx is clear.  ?Eyes:  ?   General:     ?   Right eye: No discharge.     ?   Left eye: No discharge.  ?   Conjunctiva/sclera: Conjunctivae normal.  ?Cardiovascular:  ?  Rate and Rhythm: Normal rate and regular rhythm.  ?Pulmonary:  ?   Effort: Pulmonary effort is normal.  ?   Breath sounds: Normal breath sounds. No wheezing, rhonchi or rales.  ?Neurological:  ?   Mental Status: He is alert.  ? ? ?Lab Results  ?Component Value Date  ? WBC 5.7 04/17/2021  ? HGB 15.8 04/17/2021  ? HCT 47.7 04/17/2021  ? PLT 291 04/17/2021  ? GLUCOSE 126 (H) 04/17/2021  ? CHOL 147 04/17/2021  ? TRIG 43 04/17/2021  ? HDL 39 (L) 04/17/2021  ? LDLCALC 95 04/17/2021  ? ALT 27 04/17/2021  ? AST 19 04/17/2021  ? NA 141 04/17/2021  ? K 4.7 04/17/2021  ? CL 103 04/17/2021  ? CREATININE 0.86 04/17/2021  ? BUN 15 04/17/2021  ? CO2 27 04/17/2021  ? HGBA1C 7.7 (A) 11/26/2021  ? ? ? ?Assessment & Plan:  ? ?Problem List Items Addressed This Visit   ? ?  ? Respiratory  ? Acute sinusitis  ?  Treating with Augmentin. ? ?  ?  ? Relevant Medications  ? amoxicillin-clavulanate  (AUGMENTIN) 875-125 MG tablet  ? ? ?Meds ordered this encounter  ?Medications  ? amoxicillin-clavulanate (AUGMENTIN) 875-125 MG tablet  ?  Sig: Take 1 tablet by mouth 2 (two) times daily.  ?  Dispense:  14 tablet  ?  Refill:  0  ? ?Everlene Other DO ?[facility] ? ?

## 2022-01-15 NOTE — Assessment & Plan Note (Signed)
Treating with Augmentin. 

## 2022-01-21 ENCOUNTER — Ambulatory Visit: Payer: 59 | Admitting: Family Medicine

## 2022-01-21 ENCOUNTER — Ambulatory Visit (HOSPITAL_COMMUNITY)
Admission: RE | Admit: 2022-01-21 | Discharge: 2022-01-21 | Disposition: A | Discharge status: Home / Self Care | Payer: 59 | Source: Ambulatory Visit | Attending: Family Medicine | Admitting: Family Medicine

## 2022-01-21 VITALS — BP 135/84 | HR 82 | Temp 98.5°F | Wt 239.0 lb

## 2022-01-21 DIAGNOSIS — R051 Acute cough: Secondary | ICD-10-CM

## 2022-01-21 MED ORDER — PROMETHAZINE-DM 6.25-15 MG/5ML PO SYRP: 5.0000 mL | ORAL_SOLUTION | Freq: Four times a day (QID) | ORAL | 0 refills | Status: DC | PRN

## 2022-01-21 MED ORDER — ALBUTEROL SULFATE HFA 108 (90 BASE) MCG/ACT IN AERS: 2.0000 | INHALATION_SPRAY | Freq: Four times a day (QID) | RESPIRATORY_TRACT | 0 refills | Status: DC | PRN

## 2022-01-21 NOTE — Patient Instructions (Signed)
Chest xray at the hospital. ? ?Medication as prescribed. ? ?Take care ? ?Dr. Adriana Simas  ?

## 2022-01-22 DIAGNOSIS — R051 Acute cough: Secondary | ICD-10-CM | POA: Insufficient documentation

## 2022-01-22 NOTE — Progress Notes (Signed)
? ?Subjective:  ?Patient ID: Thomas Ramsey, male    DOB: 1981/09/01  Age: 40 y.o. MRN: [id number] ? ?CC: ?Chief Complaint  ?Patient presents with  ? tightness in chest  ?  Patient was seen last week and not getting any better  ? Wheezing  ? ? ?HPI: ? ?41 year old male presents for evaluation of the above. ? ?Patient recently seen and treated for sinusitis.  Patient reports that he has been experiencing cough, chest tightness and associated wheezing.  Does not seem to be improving.  No fever.  No relieving factors.  Patient had to miss work due to illness.  No other complaints or concerns at this time. ? ?Patient Active Problem List  ? Diagnosis Date Noted  ? Acute cough 01/22/2022  ? Hyperlipidemia 01/15/2022  ? Eczema, dyshidrotic 06/16/2019  ? Obesity 12/14/2018  ? Hypertensive retinopathy of both eyes 02/15/2018  ? Nuclear sclerotic cataract of both eyes 02/15/2018  ? Erectile dysfunction 12/21/2017  ? Mild nonproliferative diabetic retinopathy (HCC) 01/25/2014  ? IBS (irritable bowel syndrome) 10/04/2013  ? Diabetes mellitus type 1 (HCC) 08/29/2013  ? Insulin pump status 08/29/2013  ? Essential (primary) hypertension 08/29/2013  ? ? ?Social Hx   ?Social History  ? ?Socioeconomic History  ? Marital status: Single  ?  Spouse name: Not on file  ? Number of children: Not on file  ? Years of education: Not on file  ? Highest education level: Not on file  ?Occupational History  ?  Employer: [employer]  ?Tobacco Use  ? Smoking status: Never  ? Smokeless tobacco: Never  ?Vaping Use  ? Vaping Use: Never used  ?Substance and Sexual Activity  ? Alcohol use: No  ? Drug use: No  ? Sexual activity: Yes  ?Other Topics Concern  ? Not on file  ?Social History Narrative  ? Not on file  ? ?Social Determinants of Health  ? ?Financial Resource Strain: Not on file  ?Food Insecurity: Not on file  ?Transportation Needs: Not on file  ?Physical Activity: Not on file  ?Stress: Not on file  ?Social Connections: Not on file   ? ? ?Review of Systems ?Per HPI ? ?Objective:  ?BP 135/84   Pulse 82   Temp 98.5 ?F (36.9 ?C) (Oral)   Wt 239 lb (108.4 kg)   SpO2 99%   BMI 31.97 kg/m?  ? ? ?  01/21/2022  ?  4:01 PM 01/15/2022  ? 10:07 AM 12/17/2021  ? 10:43 AM  ?BP/Weight  ?Systolic BP 135 131 129  ?Diastolic BP 84 84 87  ?Wt. (Lbs) 239 240   ?BMI 31.97 kg/m2 32.1 kg/m2   ? ? ?Physical Exam ?Constitutional:   ?   General: He is not in acute distress. ?   Appearance: Normal appearance.  ?HENT:  ?   Head: Normocephalic and atraumatic.  ?Eyes:  ?   General:     ?   Right eye: No discharge.     ?   Left eye: No discharge.  ?   Conjunctiva/sclera: Conjunctivae normal.  ?Cardiovascular:  ?   Rate and Rhythm: Normal rate and regular rhythm.  ?Pulmonary:  ?   Effort: Pulmonary effort is normal.  ?   Breath sounds: Normal breath sounds. No wheezing or rales.  ?Neurological:  ?   Mental Status: He is alert.  ?Psychiatric:     ?   Mood and Affect: Mood normal.     ?   Behavior: Behavior normal.  ? ? ?  Lab Results  ?Component Value Date  ? WBC 5.7 04/17/2021  ? HGB 15.8 04/17/2021  ? HCT 47.7 04/17/2021  ? PLT 291 04/17/2021  ? GLUCOSE 126 (H) 04/17/2021  ? CHOL 147 04/17/2021  ? TRIG 43 04/17/2021  ? HDL 39 (L) 04/17/2021  ? LDLCALC 95 04/17/2021  ? ALT 27 04/17/2021  ? AST 19 04/17/2021  ? NA 141 04/17/2021  ? K 4.7 04/17/2021  ? CL 103 04/17/2021  ? CREATININE 0.86 04/17/2021  ? BUN 15 04/17/2021  ? CO2 27 04/17/2021  ? HGBA1C 7.7 (A) 11/26/2021  ? ? ? ?Assessment & Plan:  ? ?Problem List Items Addressed This Visit   ? ?  ? Other  ? Acute cough - Primary  ?  Patient experiencing cough and chest tightness with reported wheezing.  No wheezing auscultated today.  Patient sent for chest x-ray and chest x-ray was independently reviewed by me.  Interpretation: Normal chest x-ray.  No evidence of infiltrate.  Treating with albuterol and Promethazine DM.  Offered corticosteroids but patient declined as this drastically increases his blood sugar. ? ?  ?  ?  Relevant Orders  ? DG Chest 2 View (Completed)  ? ? ?Meds ordered this encounter  ?Medications  ? albuterol (VENTOLIN HFA) 108 (90 Base) MCG/ACT inhaler  ?  Sig: Inhale 2 puffs into the lungs every 6 (six) hours as needed for wheezing or shortness of breath.  ?  Dispense:  8 g  ?  Refill:  0  ? promethazine-dextromethorphan (PROMETHAZINE-DM) 6.25-15 MG/5ML syrup  ?  Sig: Take 5 mLs by mouth 4 (four) times daily as needed.  ?  Dispense:  118 mL  ?  Refill:  0  ? ?Everlene Other DO ?[facility] ? ?

## 2022-01-22 NOTE — Assessment & Plan Note (Signed)
Patient experiencing cough and chest tightness with reported wheezing.  No wheezing auscultated today.  Patient sent for chest x-ray and chest x-ray was independently reviewed by me.  Interpretation: Normal chest x-ray.  No evidence of infiltrate.  Treating with albuterol and Promethazine DM.  Offered corticosteroids but patient declined as this drastically increases his blood sugar. ?

## 2022-02-04 ENCOUNTER — Ambulatory Visit: Payer: 59 | Admitting: Endocrinology

## 2022-02-13 ENCOUNTER — Other Ambulatory Visit: Payer: Self-pay | Admitting: Nurse Practitioner

## 2022-02-13 DIAGNOSIS — E103293 Type 1 diabetes mellitus with mild nonproliferative diabetic retinopathy without macular edema, bilateral: Secondary | ICD-10-CM

## 2022-04-08 ENCOUNTER — Other Ambulatory Visit: Payer: Self-pay | Admitting: Family Medicine

## 2022-07-16 ENCOUNTER — Other Ambulatory Visit: Payer: Self-pay | Admitting: Family Medicine

## 2022-07-16 DIAGNOSIS — E1169 Type 2 diabetes mellitus with other specified complication: Secondary | ICD-10-CM

## 2022-09-16 IMAGING — DX DG CHEST 2V
2 series · 2 of 2 positions shown · non-contrast
Comparison: None Available.

CLINICAL DATA: Cough, wheezing for several weeks

EXAM:
CHEST - 2 VIEW

[chest pa]
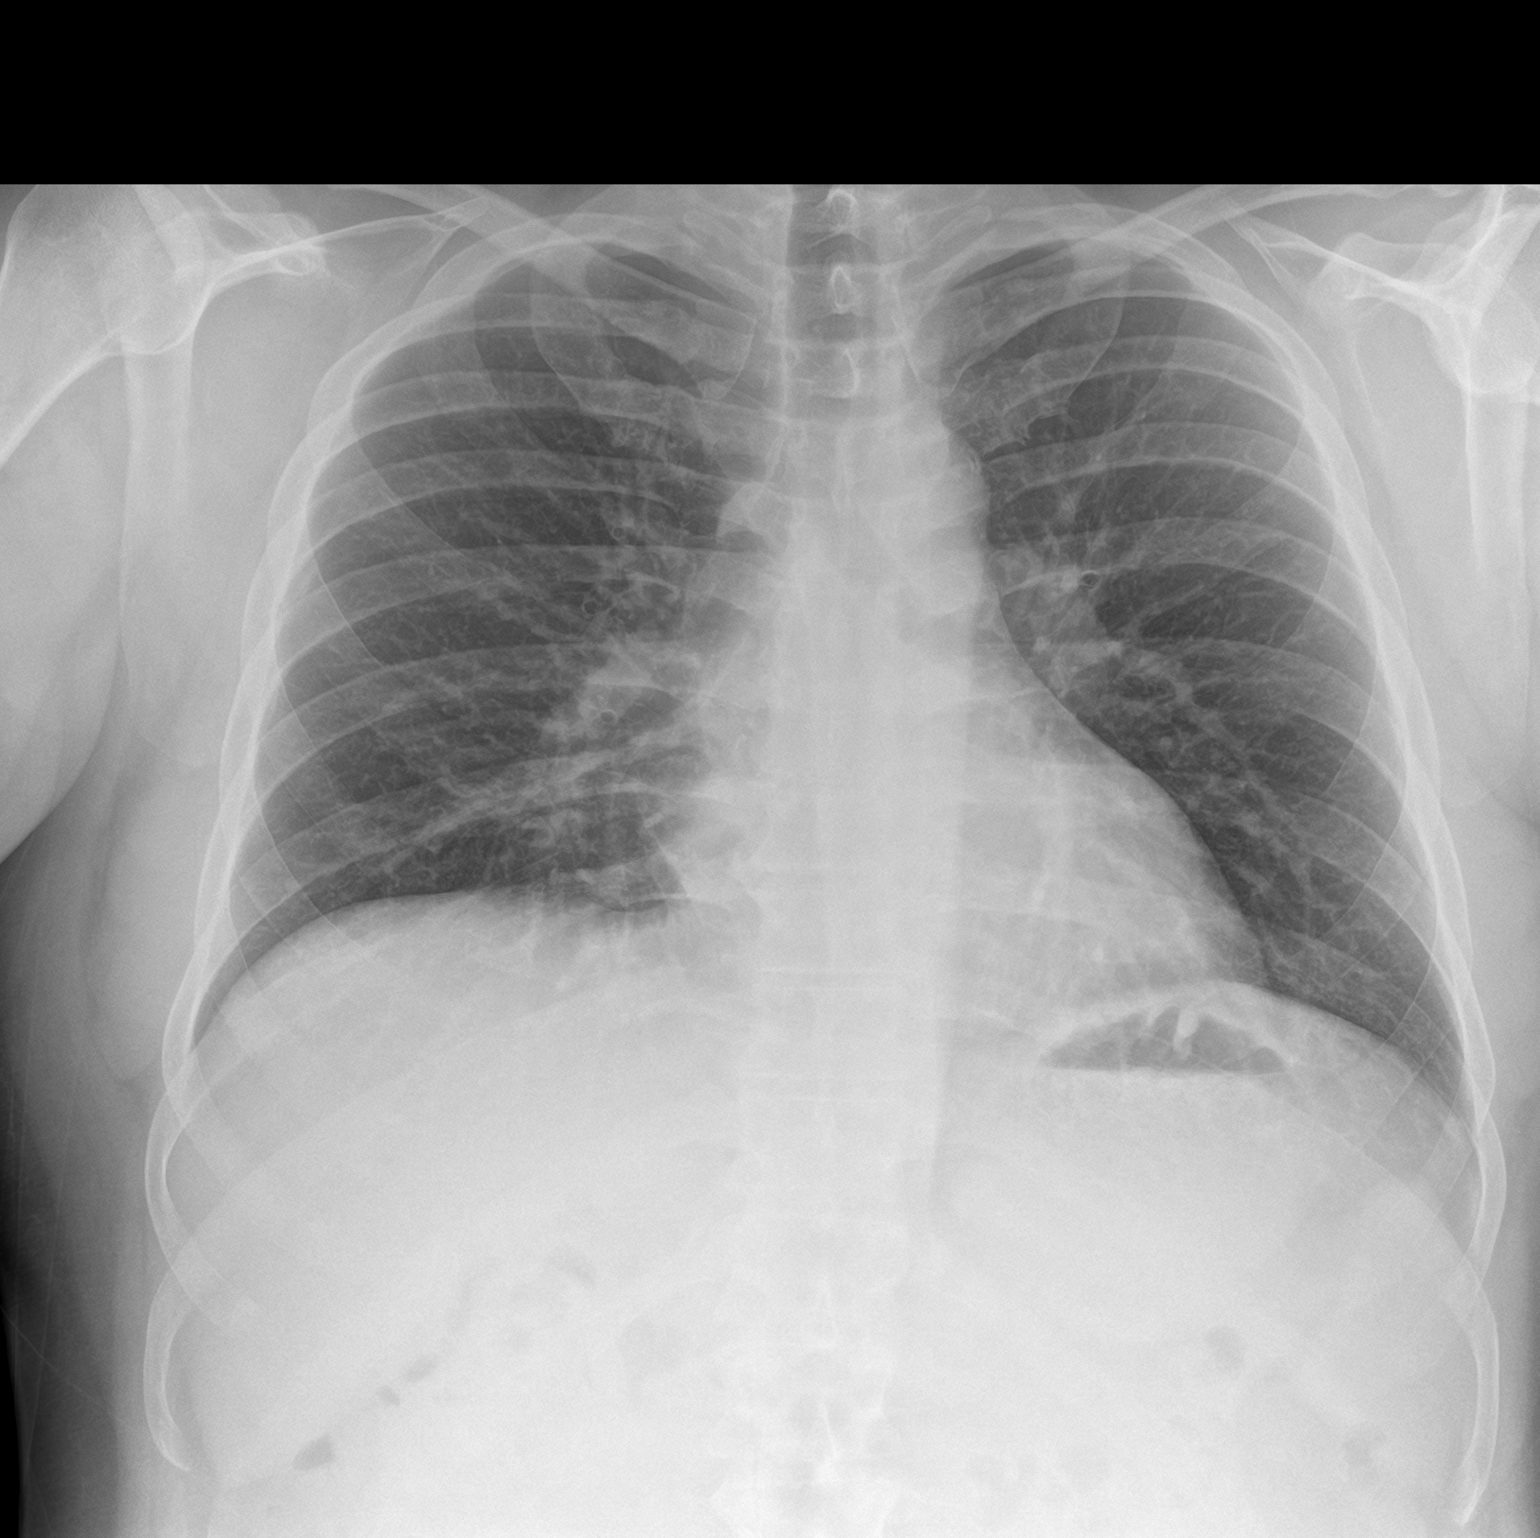

[chest lat]
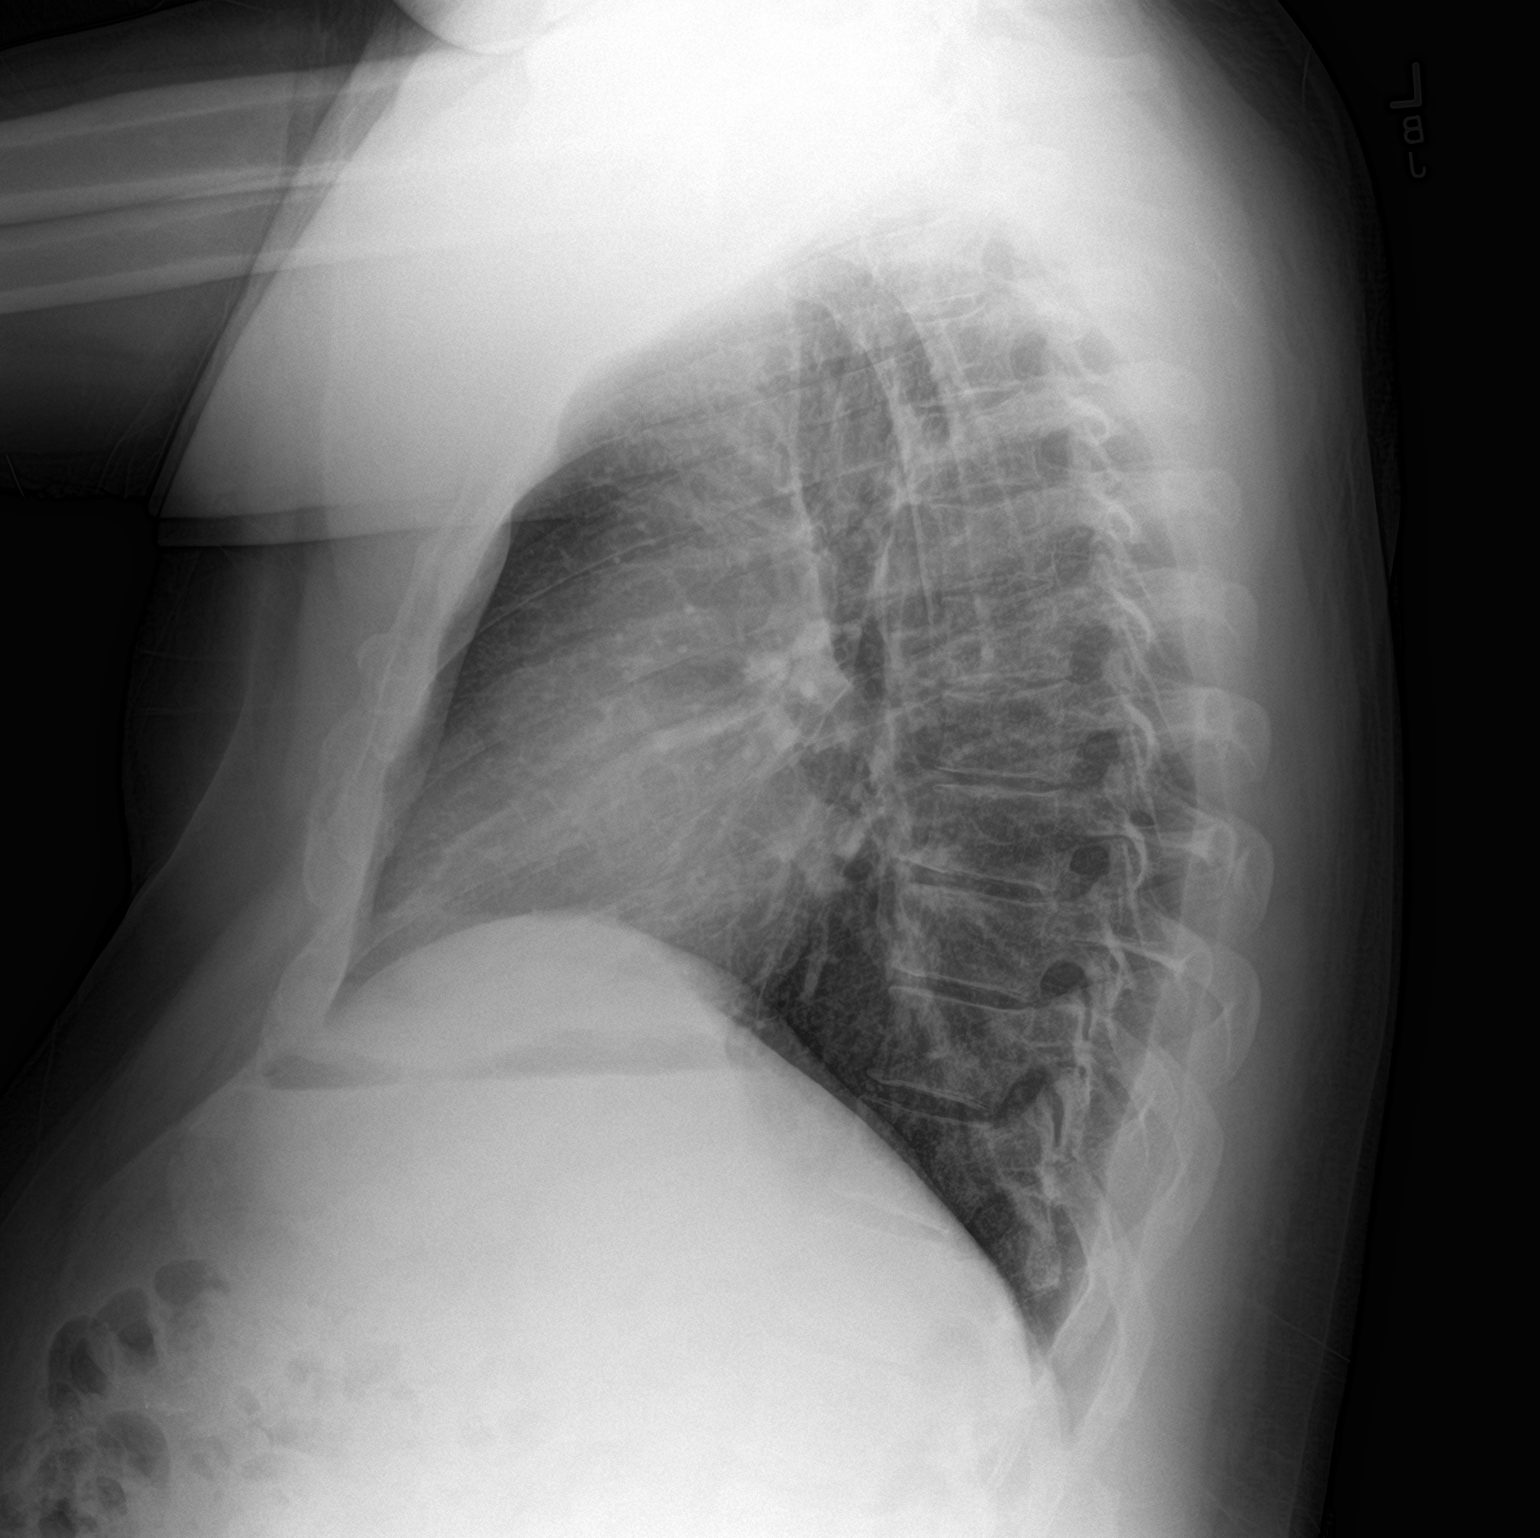

[2 of 2 positions shown; findings below may reference images not displayed]

FINDINGS: The heart size and mediastinal contours are within normal limits.
Both lungs are clear. The visualized skeletal structures are
unremarkable.
IMPRESSION: No active cardiopulmonary disease.

## 2022-10-18 ENCOUNTER — Other Ambulatory Visit: Payer: Self-pay | Admitting: Nurse Practitioner

## 2022-10-19 NOTE — Telephone Encounter (Signed)
Unable to refill per protocol, last refill by another provider not at this practice. Will refuse.  Requested Prescriptions  Pending Prescriptions Disp Refills   insulin lispro (HUMALOG) 100 UNIT/ML injection [Pharmacy Med Name: Insulin Lispro 100 UNIT/ML Subcutaneous Solution] 30 mL 0    Sig: INJECT UP TO 100 UNITS SUBCUTANEOUSLY ONCE DAILY VIA  INSULIN  PUMP     There is no refill protocol information for this order

## 2022-10-20 ENCOUNTER — Other Ambulatory Visit: Payer: Self-pay | Admitting: Nurse Practitioner

## 2022-10-21 ENCOUNTER — Other Ambulatory Visit: Payer: Self-pay | Admitting: Nurse Practitioner

## 2022-10-21 ENCOUNTER — Other Ambulatory Visit: Payer: Self-pay

## 2022-10-21 ENCOUNTER — Encounter: Payer: Self-pay | Admitting: Family Medicine

## 2022-10-21 DIAGNOSIS — E103293 Type 1 diabetes mellitus with mild nonproliferative diabetic retinopathy without macular edema, bilateral: Secondary | ICD-10-CM

## 2022-10-21 MED ORDER — HUMALOG 100 UNIT/ML IJ SOLN: INTRAMUSCULAR | 0 refills | Status: DC

## 2022-10-21 NOTE — Telephone Encounter (Signed)
Insulin refill sent to pharmacy per drs verbal orders as well as referral to endocrinology for dm1 .

## 2022-10-22 ENCOUNTER — Other Ambulatory Visit: Payer: Self-pay | Admitting: Family Medicine

## 2022-10-28 ENCOUNTER — Telehealth (INDEPENDENT_AMBULATORY_CARE_PROVIDER_SITE_OTHER): Payer: Self-pay | Admitting: Family Medicine

## 2022-10-28 DIAGNOSIS — Z7689 Persons encountering health services in other specified circumstances: Secondary | ICD-10-CM

## 2022-10-28 DIAGNOSIS — E1169 Type 2 diabetes mellitus with other specified complication: Secondary | ICD-10-CM

## 2022-10-28 DIAGNOSIS — E103293 Type 1 diabetes mellitus with mild nonproliferative diabetic retinopathy without macular edema, bilateral: Secondary | ICD-10-CM

## 2022-10-28 DIAGNOSIS — E1069 Type 1 diabetes mellitus with other specified complication: Secondary | ICD-10-CM

## 2022-10-28 DIAGNOSIS — E785 Hyperlipidemia, unspecified: Secondary | ICD-10-CM

## 2022-10-28 MED ORDER — HUMALOG 100 UNIT/ML IJ SOLN: INTRAMUSCULAR | 3 refills | Status: DC

## 2022-10-28 MED ORDER — ATORVASTATIN CALCIUM 80 MG PO TABS: 80.0000 mg | ORAL_TABLET | Freq: Every day | ORAL | 0 refills | Status: DC

## 2022-10-28 NOTE — Assessment & Plan Note (Signed)
Since 2001. Well controlled on current regimen. Continue Humalog 0-100 units daily via pump and Dexcom CGM. Reestablishing with new Endo in March. Will obtain labs with physical.

## 2022-10-28 NOTE — Progress Notes (Signed)
Virtual Visit via Video note  I connected with Fabio Pierce on 10/28/22 at 1025 by video and verified that I am speaking with the correct person using two identifiers. GALO SAYED is currently located at home and no one is currently with him during visit. The provider, Rubie Maid, FNP is located in their home at time of visit.   I discussed the limitations, risks, security and privacy concerns of performing an evaluation and management service by video and the availability of in person appointments. I also discussed with the patient that there may be a patient responsible charge related to this service. The patient expressed understanding and agreed to proceed.  Subjective: PCP: Rubie Maid, FNP  No chief complaint on file.   Mr Wion is here today to establish care. Oriented to practice routines and expectations. His PMH is significant for HLD currently on Atorvastatin 80mg  daily, and Type 1 DM on Humalog 100 units per day via insulin pump well controlled. He is establishing with a new Endocrinologist in March 2024. Diabetic foot and eye exams UTD with Endo. Vaccines UTD. Concerns today include none. No hypoglycemia, polydipsia, polyuria, visual disturbances, chest pain, or paresthesias.    ROS: Per HPI  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 in thumbs or great toes   Type I (juvenile type) diabetes mellitus without mention of complication, not stated as uncontrolled    Past Surgical History:  Procedure Laterality Date   FINGER SURGERY  1984   Reattached Index finger (right hand)    TONSILLECTOMY  1992   TYMPANOSTOMY TUBE PLACEMENT  1984   No Known Allergies   Current Outpatient Medications:    atorvastatin (LIPITOR) 80 MG tablet, Take 1 tablet by mouth once daily, Disp: 90 tablet, Rfl: 0   Continuous Blood Gluc Receiver (DEXCOM G6 RECEIVER) DEVI, USE AS DIRECTED FOR CONTINUOUS  GLUCOSE MONITORING, Disp: , Rfl:    Continuous Blood Gluc Sensor (DEXCOM G6 SENSOR) MISC, SMARTSIG:1 Topical Every 10 Days, Disp: 9 each, Rfl: 3   Continuous Blood Gluc Transmit (DEXCOM G6 TRANSMITTER) MISC, USE AS DIRECTED FOR CONTINUOUS GLUCOSE MONITORING. REUSE TRAINSMITTER FOR 90 DAYS THEN DISCARD AND REPALCE, Disp: 1 each, Rfl: 3   HUMALOG 100 UNIT/ML injection, SMARTSIG:0-100 Unit(s) SUB-Q Daily, Disp: 10 mL, Rfl: 0   lisinopril (ZESTRIL) 5 MG tablet, Take 1 tablet by mouth once daily, Disp: 90 tablet, Rfl: 0   Tadalafil 2.5 MG TABS, TAKE 1 TABLET BY MOUTH ONCE DAILY AS NEEDED FOR ERECTILE DYSFUNCTION, Disp: 30 tablet, Rfl: 0  Observations/Objective: Physical Exam Constitutional:      Appearance: Normal appearance.  Neurological:     General: No focal deficit present.     Mental Status: He is alert and oriented to person, place, and time.  Psychiatric:        Mood and Affect: Mood normal.        Behavior: Behavior normal.        Thought Content: Thought content normal.        Judgment: Judgment normal.    Assessment and Plan: Encounter to establish care Assessment & Plan: Reviewed medical history and current needs. Will follow-up with physical and fasting labs. HM items UTD.   Hyperlipidemia associated with type 2 diabetes mellitus ([place])  Type 1 diabetes mellitus with mild nonproliferative retinopathy of both eyes without macular edema ([place]) Assessment & Plan: Since 2001. Well controlled on  current regimen. Continue Humalog 0-100 units daily via pump and Dexcom CGM. Reestablishing with new Endo in March. Will obtain labs with physical.     Follow Up Instructions: Return for annual physical with fasting labs 1 week prior.   I discussed the assessment and treatment plan with the patient. The patient was provided an opportunity to ask questions and all were answered. The patient agreed with the plan and demonstrated an understanding of the instructions.   The patient was  advised to call back or seek an in-person evaluation if the symptoms worsen or if the condition fails to improve as anticipated.  The above assessment and management plan was discussed with the patient. The patient verbalized understanding of and has agreed to the management plan. Patient is aware to call the clinic if symptoms persist or worsen. Patient is aware when to return to the clinic for a follow-up visit. Patient educated on when it is appropriate to go to the emergency department.   Time call ended: 1043  I provided 17 minutes of face-to-face time during this encounter.   Mila Merry, MSN, APRN, FNP-C [place]

## 2022-10-28 NOTE — Patient Instructions (Signed)
It was great to meet you today and I'm excited to have you join the [facility] practice. I hope you had a positive experience today! If you feel so inclined, please feel free to recommend our practice to friends and family. Taran Hable, FNP-C  

## 2022-10-28 NOTE — Assessment & Plan Note (Signed)
Reviewed medical history and current needs. Will follow-up with physical and fasting labs. HM items UTD.

## 2022-11-22 ENCOUNTER — Encounter: Payer: Self-pay | Admitting: Family Medicine

## 2022-11-24 ENCOUNTER — Telehealth: Payer: Self-pay

## 2022-11-24 ENCOUNTER — Other Ambulatory Visit: Payer: Self-pay

## 2022-11-24 DIAGNOSIS — E103293 Type 1 diabetes mellitus with mild nonproliferative diabetic retinopathy without macular edema, bilateral: Secondary | ICD-10-CM

## 2022-11-24 DIAGNOSIS — Z0000 Encounter for general adult medical examination without abnormal findings: Secondary | ICD-10-CM

## 2022-11-24 DIAGNOSIS — Z7689 Persons encountering health services in other specified circumstances: Secondary | ICD-10-CM

## 2022-11-24 MED ORDER — DEXCOM G6 SENSOR MISC: 3 refills | Status: DC

## 2022-11-24 NOTE — Telephone Encounter (Signed)
Pls schedule pt for  Physical sometime next week. Pt coming for lab work on Thursday.   Thanks.

## 2022-11-24 NOTE — Telephone Encounter (Signed)
Per pt called stated request for Dexcom G6 sensor sent to [facility] on [address], [place]. Sent

## 2022-11-25 ENCOUNTER — Telehealth: Payer: Self-pay

## 2022-11-25 ENCOUNTER — Encounter: Payer: Self-pay | Admitting: Family Medicine

## 2022-11-25 NOTE — Telephone Encounter (Signed)
PA-Dexcom G6 send to plan via [employer] Form completed and put up front to be fax out 11/25/22

## 2022-11-25 NOTE — Telephone Encounter (Signed)
PA-Dexcom G6 send to plan

## 2022-11-26 ENCOUNTER — Other Ambulatory Visit: Payer: PRIVATE HEALTH INSURANCE

## 2022-11-26 ENCOUNTER — Encounter: Payer: Self-pay | Admitting: "Endocrinology

## 2022-11-26 ENCOUNTER — Other Ambulatory Visit: Payer: Self-pay

## 2022-11-26 DIAGNOSIS — Z0000 Encounter for general adult medical examination without abnormal findings: Secondary | ICD-10-CM

## 2022-11-27 LAB — PSA: PSA: 1.02 ng/mL (ref <=4.00)

## 2022-11-30 LAB — CBC WITH DIFFERENTIAL/PLATELET
Absolute Monocytes: 422 cells/uL (ref 200–950)
Basophils Absolute: 50 cells/uL (ref 0–200)
Basophils Relative: 0.8 %
Eosinophils Absolute: 113 cells/uL (ref 15–500)
Eosinophils Relative: 1.8 %
HCT: 45.9 % (ref 38.5–50.0)
Hemoglobin: 15.3 g/dL (ref 13.2–17.1)
Lymphs Abs: 1550 cells/uL (ref 850–3900)
MCH: 29.8 pg (ref 27.0–33.0)
MCHC: 33.3 g/dL (ref 32.0–36.0)
MCV: 89.3 fL (ref 80.0–100.0)
MPV: 10 fL (ref 7.5–12.5)
Monocytes Relative: 6.7 %
Neutro Abs: 4164 cells/uL (ref 1500–7800)
Neutrophils Relative %: 66.1 %
Platelets: 296 10*3/uL (ref 140–400)
RBC: 5.14 10*6/uL (ref 4.20–5.80)
RDW: 12.3 % (ref 11.0–15.0)
Total Lymphocyte: 24.6 %
WBC: 6.3 10*3/uL (ref 3.8–10.8)

## 2022-11-30 LAB — COMPLETE METABOLIC PANEL WITH GFR
AG Ratio: 2 (calc) (ref 1.0–2.5)
ALT: 35 U/L (ref 9–46)
AST: 23 U/L (ref 10–40)
Albumin: 4.3 g/dL (ref 3.6–5.1)
Alkaline phosphatase (APISO): 92 U/L (ref 36–130)
BUN: 16 mg/dL (ref 7–25)
CO2: 27 mmol/L (ref 20–32)
Calcium: 9.2 mg/dL (ref 8.6–10.3)
Chloride: 102 mmol/L (ref 98–110)
Creat: 1 mg/dL (ref 0.60–1.29)
Globulin: 2.2 g/dL (calc) (ref 1.9–3.7)
Glucose, Bld: 272 mg/dL — ABNORMAL HIGH (ref 65–99)
Potassium: 4.7 mmol/L (ref 3.5–5.3)
Sodium: 138 mmol/L (ref 135–146)
Total Bilirubin: 0.6 mg/dL (ref 0.2–1.2)
Total Protein: 6.5 g/dL (ref 6.1–8.1)
eGFR: 97 mL/min/{1.73_m2} (ref >=60)

## 2022-11-30 LAB — LIPID PANEL
Cholesterol: 131 mg/dL (ref <200)
HDL: 36 mg/dL — ABNORMAL LOW (ref >=40)
LDL Cholesterol (Calc): 72 mg/dL (calc)
Non-HDL Cholesterol (Calc): 95 mg/dL (calc) (ref <130)
Total CHOL/HDL Ratio: 3.6 (calc) (ref <5.0)
Triglycerides: 148 mg/dL (ref <150)

## 2022-11-30 LAB — HEMOGLOBIN A1C
Hgb A1c MFr Bld: 8.4 % of total Hgb — ABNORMAL HIGH (ref <5.7)
Mean Plasma Glucose: 194 mg/dL
eAG (mmol/L): 10.8 mmol/L

## 2022-11-30 LAB — MICROALBUMIN / CREATININE URINE RATIO

## 2022-12-16 ENCOUNTER — Ambulatory Visit (INDEPENDENT_AMBULATORY_CARE_PROVIDER_SITE_OTHER): Payer: PRIVATE HEALTH INSURANCE | Admitting: "Endocrinology

## 2022-12-16 ENCOUNTER — Encounter: Payer: Self-pay | Admitting: "Endocrinology

## 2022-12-16 VITALS — BP 138/84 | HR 72 | Ht 72.5 in | Wt 242.6 lb

## 2022-12-16 DIAGNOSIS — E782 Mixed hyperlipidemia: Secondary | ICD-10-CM | POA: Diagnosis not present

## 2022-12-16 DIAGNOSIS — E103293 Type 1 diabetes mellitus with mild nonproliferative diabetic retinopathy without macular edema, bilateral: Secondary | ICD-10-CM

## 2022-12-16 NOTE — Patient Instructions (Signed)

## 2022-12-16 NOTE — Progress Notes (Signed)
Endocrinology Consult Note       12/16/2022, 1:03 PM   Subjective:    Patient ID: Thomas Ramsey, male    DOB: [birth year].  Fabio Pierce is being seen in consultation for management of currently uncontrolled symptomatic diabetes requested by  Rubie Maid, FNP.   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 in thumbs or great toes   Type I (juvenile type) diabetes mellitus without mention of complication, not stated as uncontrolled     Past Surgical History:  Procedure Laterality Date   FINGER SURGERY  1984   Reattached Index finger (right hand)    TONSILLECTOMY  1992   TYMPANOSTOMY TUBE PLACEMENT  1984    Social History   Socioeconomic History   Marital status: Single    Spouse name: Not on file   Number of children: Not on file   Years of education: Not on file   Highest education level: Not on file  Occupational History    Employer: [employer]  Tobacco Use   Smoking status: Never   Smokeless tobacco: Never  Vaping Use   Vaping Use: Never used  Substance and Sexual Activity   Alcohol use: No   Drug use: No   Sexual activity: Yes  Other Topics Concern   Not on file  Social History Narrative   Not on file   Social Determinants of Health   Financial Resource Strain: Low Risk  (12/21/2017)   Overall Financial Resource Strain (CARDIA)    Difficulty of Paying Living Expenses: Not hard at all  Food Insecurity: No Food Insecurity (12/21/2017)   Hunger Vital Sign    Worried About Running Out of Food in the Last Year: Never true    Ran Out of Food in the Last Year: Never true  Transportation Needs: No Transportation Needs (12/21/2017)   PRAPARE - Hydrologist (Medical): No    Lack of Transportation (Non-Medical): No  Physical Activity: Not on file  Stress: Not on file   Social Connections: Not on file    Family History  Problem Relation Age of Onset   Thyroid disease Mother    Hyperlipidemia Mother    Cancer Father    Hyperlipidemia Father    Hypertension Father    Diabetes Neg Hx     Outpatient Encounter Medications as of 12/16/2022  Medication Sig   atorvastatin (LIPITOR) 80 MG tablet Take 1 tablet (80 mg total) by mouth daily.   Continuous Blood Gluc Receiver (DEXCOM G6 RECEIVER) DEVI USE AS DIRECTED FOR CONTINUOUS GLUCOSE MONITORING   Continuous Blood Gluc Sensor (DEXCOM G6 SENSOR) MISC SMARTSIG:1 Topical Every 10 Days   Continuous Blood Gluc Transmit (DEXCOM G6 TRANSMITTER) MISC USE AS DIRECTED FOR CONTINUOUS GLUCOSE MONITORING. REUSE TRAINSMITTER FOR 90 DAYS THEN DISCARD AND REPALCE   HUMALOG 100 UNIT/ML injection SMARTSIG:0-100 Unit(s) SUB-Q Daily   lisinopril (ZESTRIL) 5 MG tablet Take 1 tablet by mouth once daily (Patient not taking: Reported on 12/16/2022)   Tadalafil 2.5 MG TABS TAKE 1 TABLET BY  MOUTH ONCE DAILY AS NEEDED FOR ERECTILE DYSFUNCTION   No facility-administered encounter medications on file as of 12/16/2022.    ALLERGIES: No Known Allergies  VACCINATION STATUS: Immunization History  Administered Date(s) Administered   Hepatitis B 06/20/2019   Influenza,inj,Quad PF,6+ Mos 06/16/2019   Influenza-Unspecified 08/01/2013   PFIZER(Purple Top)SARS-COV-2 Vaccination 11/11/2019, 12/05/2019   Pneumococcal Polysaccharide-23 04/17/2014   Tdap 03/23/2016    Diabetes He presents for his initial diabetic visit. Diabetes type: ?  Patient was diagnosed at approximate age of 20 years when he weighed 220 pounds.  He did have 1 epepisode of DKA when his blood glucose rose to 1280.  Patient does not recall if he ever had antibodies tested to classify his diabetes. His disease course has been worsening. There are no hypoglycemic associated symptoms. Pertinent negatives for hypoglycemia include no confusion, headaches, pallor or seizures.  Associated symptoms include polydipsia and polyuria. Pertinent negatives for diabetes include no chest pain, no fatigue, no polyphagia and no weakness. There are no hypoglycemic complications. Symptoms are worsening. Diabetic complications include impotence and retinopathy. Risk factors for coronary artery disease include diabetes mellitus, dyslipidemia, male sex, obesity and family history. Current diabetic treatment includes insulin pump. His weight is fluctuating minimally. He is following a generally unhealthy diet. When asked about meal planning, he reported none. He has not had a previous visit with a dietitian. He participates in exercise intermittently. His home blood glucose trend is fluctuating minimally. His overall blood glucose range is 180-200 mg/dl. (He wears t:slim insulin pump with rapid acting insulin settings 1.9 units/h for basal, ICR 1:2, target glucose of 110, IAT5 hours, ISFr 1:23. His recent A1c was 8.4%.) An ACE inhibitor/angiotensin II receptor blocker is being taken.  Hyperlipidemia This is a chronic problem. The current episode started more than 1 year ago. The problem is controlled. Pertinent negatives include no chest pain, myalgias or shortness of breath. Current antihyperlipidemic treatment includes statins. Risk factors for coronary artery disease include diabetes mellitus, dyslipidemia, male sex and family history.    Review of Systems  Constitutional:  Negative for chills, fatigue, fever and unexpected weight change.  HENT:  Negative for dental problem, mouth sores and trouble swallowing.   Eyes:  Negative for visual disturbance.  Respiratory:  Negative for cough, choking, chest tightness, shortness of breath and wheezing.   Cardiovascular:  Negative for chest pain, palpitations and leg swelling.  Gastrointestinal:  Negative for abdominal distention, abdominal pain, constipation, diarrhea, nausea and vomiting.  Endocrine: Positive for polydipsia and polyuria. Negative  for polyphagia.  Genitourinary:  Positive for impotence. Negative for dysuria, flank pain, hematuria and urgency.  Musculoskeletal:  Negative for back pain, gait problem, myalgias and neck pain.  Skin:  Negative for pallor, rash and wound.  Neurological:  Negative for seizures, syncope, weakness, numbness and headaches.  Psychiatric/Behavioral: Negative.  Negative for confusion and dysphoric mood.     Objective:       12/16/2022   10:31 AM 01/21/2022    4:01 PM 01/15/2022   10:07 AM  Vitals with BMI  Height 6' 0.5"  6' 0.5"  Weight 242 lbs 10 oz 239 lbs 240 lbs  BMI 32.43 [id number]  Systolic 0000000 [id number]  Diastolic 84 84 84  Pulse 72 82 77    BP 138/84   Pulse 72   Ht 6' 0.5" (1.842 m)   Wt 242 lb 9.6 oz (110 kg)   BMI 32.45 kg/m   Wt Readings from Last 3 Encounters:  12/16/22  242 lb 9.6 oz (110 kg)  01/21/22 239 lb (108.4 kg)  01/15/22 240 lb (108.9 kg)     Physical Exam Constitutional:      General: He is not in acute distress.    Appearance: He is well-developed.  HENT:     Head: Normocephalic and atraumatic.  Neck:     Thyroid: No thyromegaly.     Trachea: No tracheal deviation.  Cardiovascular:     Rate and Rhythm: Normal rate.     Pulses:          Dorsalis pedis pulses are 1+ on the right side and 1+ on the left side.       Posterior tibial pulses are 1+ on the right side and 1+ on the left side.     Heart sounds: Normal heart sounds, S1 normal and S2 normal. No murmur heard.    No gallop.  Pulmonary:     Effort: No respiratory distress.     Breath sounds: Normal breath sounds. No wheezing.  Abdominal:     General: Bowel sounds are normal. There is no distension.     Palpations: Abdomen is soft.     Tenderness: There is no abdominal tenderness. There is no guarding.  Musculoskeletal:     Right shoulder: No swelling or deformity.     Cervical back: Normal range of motion and neck supple.  Skin:    General: Skin is warm and dry.     Findings: No rash.      Nails: There is no clubbing.  Neurological:     Mental Status: He is alert and oriented to person, place, and time.     Cranial Nerves: No cranial nerve deficit.     Sensory: No sensory deficit.     Gait: Gait normal.     Deep Tendon Reflexes: Reflexes are normal and symmetric.  Psychiatric:        Speech: Speech normal.        Behavior: Behavior normal. Behavior is cooperative.        Thought Content: Thought content normal.        Judgment: Judgment normal.    CMP     Component Value Date/Time   NA 138 11/26/2022 0837   K 4.7 11/26/2022 0837   CL 102 11/26/2022 0837   CO2 27 11/26/2022 0837   GLUCOSE 272 (H) 11/26/2022 0837   BUN 16 11/26/2022 0837   CREATININE 1.00 11/26/2022 0837   CALCIUM 9.2 11/26/2022 0837   PROT 6.5 11/26/2022 0837   AST 23 11/26/2022 0837   ALT 35 11/26/2022 0837   BILITOT 0.6 11/26/2022 0837   GFRNONAA 103 06/09/2018 1004   GFRAA 120 06/09/2018 1004    Diabetic Labs (most recent): Lab Results  Component Value Date   HGBA1C 8.4 (H) 11/26/2022   HGBA1C 7.7 (A) 11/26/2021   HGBA1C 7.6 (A) 06/24/2021     Lipid Panel ( most recent) Lipid Panel     Component Value Date/Time   CHOL 131 11/26/2022 0837   TRIG 148 11/26/2022 0837   HDL 36 (L) 11/26/2022 0837   CHOLHDL 3.6 11/26/2022 0837   LDLCALC 72 11/26/2022 0837     Assessment & Plan:   1. 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 DM since  42 years of age,  with most recent A1c of 18 %. Recent labs reviewed.  Patient was diagnosed at approximate age of 89 years when he weighed 220 pounds.  He was always treated as type 1 diabetes.  He did have 1 episode of DKA in 2010 when his blood glucose rose to 1280. Patient does not recall if he ever had antibodies tested to classify his diabets   He wears t:slim insulin pump with rapid acting insulin settings  1.9 units/h for basal, ICR 1:2, target glucose of 110, IAT5 hours, ISFr 1:23. His recent A1c was 8.4%.  - I had a long discussion with him about the possible risk factors and  the pathology behind its diabetes and its complications. Patient needs further workup to classify his diabetes-see below. -his diabetes is complicated by non proliferative retinopathy, high BMI of 30.4.  he remains at a high risk for more acute and chronic complications which include CAD, CVA, CKD, retinopathy, and neuropathy. These are all discussed in detail with him.  - I discussed all available options of managing his diabetes including de-escalation of medications. I have counseled him on Food as Medicine by adopting a Whole Food , Plant Predominant  ( WFPP) nutrition as recommended by [employer] of Lifestyle Medicine. Patient is encouraged to switch to  unprocessed or minimally processed  complex starch, adequate protein intake (mainly plant source), minimal liquid fat, plenty of fruits, and vegetables. -  he is advised to stick to a routine mealtimes to eat 3 complete meals a day and snack only when necessary ( to snack only to correct hypoglycemia BG <70 day time or <100 at night).   - he acknowledges that there is a room for improvement in his food and drink choices. - Further Specific Suggestion is made for him to avoid simple carbohydrates  from his diet including Cakes, Sweet Desserts, Ice Cream, Soda (diet and regular), Sweet Tea, Candies, Chips, Cookies, Store Bought Juices, Alcohol ,  Artificial Sweeteners,  Coffee Creamer, and "Sugar-free" Products. This will help patient to have more stable blood glucose profile and potentially avoid unintended weight gain.  The following Lifestyle Medicine recommendations according to [facility]) were discussed and offered to patient and he agrees to start the journey:  A.  Whole Foods, Plant-based plate comprising of fruits and  vegetables, plant-based proteins, whole-grain carbohydrates was discussed in detail with the patient.   A list for source of those nutrients were also provided to the patient.  Patient will use only water or unsweetened tea for hydration. B.  The need to stay away from risky substances including alcohol, smoking; obtaining 7 to 9 hours of restorative sleep, at least 150 minutes of moderate intensity exercise weekly, the importance of healthy social connections,  and stress reduction techniques were discussed. C.  A full color page of  Calorie density of various food groups per pound showing examples of each food groups was provided to the patient.  - he will be scheduled with Jearld Fenton, RDN, CDE for individualized diabetes education.  - I have approached him with the following individualized plan to manage  his diabetes and patient agrees:   -I have reviewed and kept him on the same insulin pump settings, see above.  He will have the following labs tomorrow morning. - IA-2 Autoantibodies - ZNT8 Antibodies - Glutamic acid decarboxylase auto abs - TSH - T4, free - Vitamin B12 - VITAMIN D 25 Hydroxy (Vit-D Deficiency, Fractures) -He will return in 7-10 days for reevaluation.  If he does not have at least 1  antibody positive, he will be considered for non-insulin treatment options which would be slowly introduced to see if he can lower his insulin burden.  He is currently utilizing up to 100 units of insulin daily-Humalog. He is encouraged to use his Dexcom CGM.  He is advised to call clinic for hypoglycemia under 70 or hyperglycemia above 200 mg per DL.  - Specific targets for  A1c;  LDL, HDL,  and Triglycerides were discussed with the patient.  2) Blood Pressure /Hypertension:  his blood pressure is controlled to target.   he is currently on lisinopril 5 mg p.o. daily.    3) Lipids/Hyperlipidemia:   Review of his recent lipid panel showed controlled  LDL at 72 .  he  is advised to continue  Lipitor 80 mg daily at bedtime.  Side effects and precautions discussed with him.  4)  Weight/Diet:  Body mass index is 32.45 kg/m.  -   clearly complicating his diabetes care.   he is  a candidate for weight loss. I discussed with him the fact that loss of 5 - 10% of his  current body weight will have the most impact on his diabetes management.  The above detailed  ACLM recommendations for nutrition, exercise, sleep, social life, avoidance of risky substances, the need for restorative sleep   information will also detailed on discharge instructions.  5) Chronic Care/Health Maintenance:  -he  is on ACEI/ARB and Statin medications and  is encouraged to initiate and continue to follow up with Ophthalmology, Dentist,  Podiatrist at least yearly or according to recommendations, and advised to   stay away from smoking. I have recommended yearly flu vaccine and pneumonia vaccine at least every 5 years; moderate intensity exercise for up to 150 minutes weekly; and  sleep for 7- 9 hours a day.  His DM Foot Exam is normal on December 16, 2022.  - he is  advised to maintain close follow up with Rubie Maid, FNP for primary care needs, as well as his other providers for optimal and coordinated care.   I spent 62 minutes in the care of the patient today including review of labs from [place], Lipids, Thyroid Function, Hematology (current and previous including abstractions from other facilities); face-to-face time discussing  his blood glucose readings/logs, discussing hypoglycemia and hyperglycemia episodes and symptoms, medications doses, his options of short and long term treatment based on the latest standards of care / guidelines;  discussion about incorporating lifestyle medicine;  and documenting the encounter. Risk reduction counseling performed per USPSTF guidelines to reduce  obesity and cardiovascular risk factors.      Please refer to Patient Instructions for Blood Glucose Monitoring and  Insulin/Medications Dosing Guide"  in media tab for additional information. Please  also refer to " Patient Self Inventory" in the Media  tab for reviewed elements of pertinent patient history.  Fabio Pierce participated in the discussions, expressed understanding, and voiced agreement with the above plans.  All questions were answered to his satisfaction. he is encouraged to contact clinic should he have any questions or concerns prior to his return visit.   Follow up plan: - Return in about 10 days (around 12/26/2022) for F/U with Pre-visit Labs.  Glade Lloyd, MD [facility] [address] [place], [place] [address] Phone: [phone]  Fax: [phone]    12/16/2022, 1:03 PM  This note was partially dictated with voice recognition software. Similar sounding words can be transcribed inadequately or  may not  be corrected upon review.

## 2022-12-26 LAB — IA-2 AUTOANTIBODIES: IA-2 Autoantibodies: <7.5 U/mL

## 2022-12-26 LAB — GLUTAMIC ACID DECARBOXYLASE AUTO ABS: Glutamic Acid Decarb Ab: 26.7 U/mL — ABNORMAL HIGH (ref 0.0–5.0)

## 2022-12-26 LAB — ZNT8 ANTIBODIES: ZNT8 Antibodies: 199 U/mL — ABNORMAL HIGH

## 2022-12-26 LAB — TSH: TSH: 1.2 u[IU]/mL (ref 0.450–4.500)

## 2022-12-26 LAB — VITAMIN B12: Vitamin B-12: 507 pg/mL (ref 232–1245)

## 2022-12-26 LAB — T4, FREE: Free T4: 1.45 ng/dL (ref 0.82–1.77)

## 2022-12-26 LAB — VITAMIN D 25 HYDROXY (VIT D DEFICIENCY, FRACTURES): Vit D, 25-Hydroxy: 23.2 ng/mL — ABNORMAL LOW (ref 30.0–100.0)

## 2022-12-31 ENCOUNTER — Ambulatory Visit (INDEPENDENT_AMBULATORY_CARE_PROVIDER_SITE_OTHER): Payer: PRIVATE HEALTH INSURANCE | Admitting: "Endocrinology

## 2022-12-31 ENCOUNTER — Encounter: Payer: Self-pay | Admitting: "Endocrinology

## 2022-12-31 VITALS — BP 114/78 | HR 60 | Ht 72.5 in | Wt 236.6 lb

## 2022-12-31 DIAGNOSIS — E782 Mixed hyperlipidemia: Secondary | ICD-10-CM | POA: Diagnosis not present

## 2022-12-31 DIAGNOSIS — E103293 Type 1 diabetes mellitus with mild nonproliferative diabetic retinopathy without macular edema, bilateral: Secondary | ICD-10-CM | POA: Diagnosis not present

## 2022-12-31 DIAGNOSIS — E559 Vitamin D deficiency, unspecified: Secondary | ICD-10-CM | POA: Diagnosis not present

## 2022-12-31 DIAGNOSIS — Z6831 Body mass index (BMI) 31.0-31.9, adult: Secondary | ICD-10-CM

## 2022-12-31 DIAGNOSIS — E6609 Other obesity due to excess calories: Secondary | ICD-10-CM

## 2022-12-31 MED ORDER — VITAMIN D 50 MCG (2000 UT) PO CAPS: 2000.0000 [IU] | ORAL_CAPSULE | Freq: Every day | ORAL | 1 refills | Status: DC

## 2022-12-31 NOTE — Patient Instructions (Signed)

## 2022-12-31 NOTE — Progress Notes (Signed)
Endocrinology Consult Note       12/31/2022, 6:38 PM   Subjective:    Patient ID: Thomas Ramsey, male    DOB: 03/15/1981.  Thomas Ramsey is being seen in consultation for management of currently uncontrolled symptomatic diabetes requested by  Park Meo, FNP.   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 in thumbs or great toes   Type I (juvenile type) diabetes mellitus without mention of complication, not stated as uncontrolled     Past Surgical History:  Procedure Laterality Date   FINGER SURGERY  1984   Reattached Index finger (right hand)    TONSILLECTOMY  1992   TYMPANOSTOMY TUBE PLACEMENT  1984    Social History   Socioeconomic History   Marital status: Single    Spouse name: Not on file   Number of children: Not on file   Years of education: Not on file   Highest education level: Not on file  Occupational History    Employer: [employer]  Tobacco Use   Smoking status: Never   Smokeless tobacco: Never  Vaping Use   Vaping Use: Never used  Substance and Sexual Activity   Alcohol use: No   Drug use: No   Sexual activity: Yes  Other Topics Concern   Not on file  Social History Narrative   Not on file   Social Determinants of Health   Financial Resource Strain: Low Risk  (12/21/2017)   Overall Financial Resource Strain (CARDIA)    Difficulty of Paying Living Expenses: Not hard at all  Food Insecurity: No Food Insecurity (12/21/2017)   Hunger Vital Sign    Worried About Running Out of Food in the Last Year: Never true    Ran Out of Food in the Last Year: Never true  Transportation Needs: No Transportation Needs (12/21/2017)   PRAPARE - Administrator, Civil Service (Medical): No    Lack of Transportation (Non-Medical): No  Physical Activity: Not on file  Stress: Not on file   Social Connections: Not on file    Family History  Problem Relation Age of Onset   Thyroid disease Mother    Hyperlipidemia Mother    Cancer Father    Hyperlipidemia Father    Hypertension Father    Diabetes Neg Hx     Outpatient Encounter Medications as of 12/31/2022  Medication Sig   [DISCONTINUED] Cholecalciferol (VITAMIN D) 50 MCG (2000 UT) CAPS Take 2,000 Units by mouth daily with breakfast.   atorvastatin (LIPITOR) 80 MG tablet Take 1 tablet (80 mg total) by mouth daily.   Cholecalciferol (VITAMIN D) 50 MCG (2000 UT) CAPS Take 1 capsule (2,000 Units total) by mouth daily with breakfast.   Continuous Blood Gluc Receiver (DEXCOM G6 RECEIVER) DEVI USE AS DIRECTED FOR CONTINUOUS GLUCOSE MONITORING   Continuous Blood Gluc Sensor (DEXCOM G6 SENSOR) MISC SMARTSIG:1 Topical Every 10 Days   Continuous Blood Gluc Transmit (DEXCOM G6 TRANSMITTER) MISC USE AS DIRECTED FOR CONTINUOUS GLUCOSE MONITORING. REUSE TRAINSMITTER FOR 90 DAYS THEN DISCARD AND REPALCE  HUMALOG 100 UNIT/ML injection SMARTSIG:0-100 Unit(s) SUB-Q Daily   lisinopril (ZESTRIL) 5 MG tablet Take 1 tablet by mouth once daily (Patient not taking: Reported on 12/16/2022)   Tadalafil 2.5 MG TABS TAKE 1 TABLET BY MOUTH ONCE DAILY AS NEEDED FOR ERECTILE DYSFUNCTION   No facility-administered encounter medications on file as of 12/31/2022.    ALLERGIES: No Known Allergies  VACCINATION STATUS: Immunization History  Administered Date(s) Administered   Hepatitis B 06/20/2019   Influenza,inj,Quad PF,6+ Mos 06/16/2019   Influenza-Unspecified 08/01/2013   PFIZER(Purple Top)SARS-COV-2 Vaccination 11/11/2019, 12/05/2019   Pneumococcal Polysaccharide-23 04/17/2014   Tdap 03/23/2016    Diabetes He presents for his follow-up diabetic visit. He has type 1 (?  Patient was diagnosed at approximate age of 18 years when he weighed 220 pounds.  He did have 1 epepisode of DKA when his blood glucose rose to 1280.  Patient does not recall  if he ever had antibodies tested to classify his diabetes.) diabetes mellitus. His disease course has been improving. There are no hypoglycemic associated symptoms. Pertinent negatives for hypoglycemia include no confusion, headaches, pallor or seizures. Pertinent negatives for diabetes include no chest pain, no fatigue, no polydipsia, no polyphagia, no polyuria and no weakness. There are no hypoglycemic complications. Symptoms are improving. Diabetic complications include impotence and retinopathy. Risk factors for coronary artery disease include diabetes mellitus, dyslipidemia, male sex, obesity and family history. Current diabetic treatment includes insulin pump. His weight is decreasing steadily (He presents with 6 pounds of weight loss since last visit.). He is following a generally unhealthy diet. When asked about meal planning, he reported none. He has not had a previous visit with a dietitian. He participates in exercise intermittently. His home blood glucose trend is decreasing steadily. His breakfast blood glucose range is generally 140-180 mg/dl. His lunch blood glucose range is generally 140-180 mg/dl. His dinner blood glucose range is generally 140-180 mg/dl. His bedtime blood glucose range is generally 140-180 mg/dl. His overall blood glucose range is 140-180 mg/dl. (He made significant changes in his lifestyle and presents with significant improvement in his glycemic profile.  He wears t:slim tandem source showing a summary of 63% time in range, 26% level 1 hyperglycemia, 11% level 2 hyperglycemia.  This is a sharp improvement from his previous 2 weeks when he had 53% time range, 32% level 1 hyperglycemia, 14% level 2 hyperglycemia.  He did not have significant hypoglycemia.  He wears t:slim insulin pump with rapid acting insulin settings 1.9 units/h for basal, ICR 1:2, target glucose of 110, IAT5 hours, ISFr 1:23. His recent A1c was 8.4%.) An ACE inhibitor/angiotensin II receptor blocker is being  taken.  Hyperlipidemia This is a chronic problem. The current episode started more than 1 year ago. The problem is controlled. Pertinent negatives include no chest pain, myalgias or shortness of breath. Current antihyperlipidemic treatment includes statins. Risk factors for coronary artery disease include diabetes mellitus, dyslipidemia, male sex and family history.    Review of Systems  Constitutional:  Negative for chills, fatigue, fever and unexpected weight change.  HENT:  Negative for dental problem, mouth sores and trouble swallowing.   Eyes:  Negative for visual disturbance.  Respiratory:  Negative for cough, choking, chest tightness, shortness of breath and wheezing.   Cardiovascular:  Negative for chest pain, palpitations and leg swelling.  Gastrointestinal:  Negative for abdominal distention, abdominal pain, constipation, diarrhea, nausea and vomiting.  Endocrine: Negative for polydipsia, polyphagia and polyuria.  Genitourinary:  Positive for impotence. Negative for dysuria,  flank pain, hematuria and urgency.  Musculoskeletal:  Negative for back pain, gait problem, myalgias and neck pain.  Skin:  Negative for pallor, rash and wound.  Neurological:  Negative for seizures, syncope, weakness, numbness and headaches.  Psychiatric/Behavioral: Negative.  Negative for confusion and dysphoric mood.     Objective:       12/31/2022    8:38 AM 12/16/2022   10:31 AM 01/21/2022    4:01 PM  Vitals with BMI  Height 6' 0.5" 6' 0.5"   Weight 236 lbs 10 oz 242 lbs 10 oz 239 lbs  BMI 31.63 32.43 31.95  Systolic 114 138 449  Diastolic 78 84 84  Pulse 60 72 82    BP 114/78   Pulse 60   Ht 6' 0.5" (1.842 m)   Wt 236 lb 9.6 oz (107.3 kg)   BMI 31.65 kg/m   Wt Readings from Last 3 Encounters:  12/31/22 236 lb 9.6 oz (107.3 kg)  12/16/22 242 lb 9.6 oz (110 kg)  01/21/22 239 lb (108.4 kg)      CMP     Component Value Date/Time   NA 138 11/26/2022 0837   K 4.7 11/26/2022 0837   CL  102 11/26/2022 0837   CO2 27 11/26/2022 0837   GLUCOSE 272 (H) 11/26/2022 0837   BUN 16 11/26/2022 0837   CREATININE 1.00 11/26/2022 0837   CALCIUM 9.2 11/26/2022 0837   PROT 6.5 11/26/2022 0837   AST 23 11/26/2022 0837   ALT 35 11/26/2022 0837   BILITOT 0.6 11/26/2022 0837   GFRNONAA 103 06/09/2018 1004   GFRAA 120 06/09/2018 1004    Diabetic Labs (most recent): Lab Results  Component Value Date   HGBA1C 8.4 (H) 11/26/2022   HGBA1C 7.7 (A) 11/26/2021   HGBA1C 7.6 (A) 06/24/2021     Lipid Panel ( most recent) Lipid Panel     Component Value Date/Time   CHOL 131 11/26/2022 0837   TRIG 148 11/26/2022 0837   HDL 36 (L) 11/26/2022 0837   CHOLHDL 3.6 11/26/2022 0837   LDLCALC 72 11/26/2022 0837     Assessment & Plan:   1. 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 Recent labs reviewed.  His previsit workup confirms type 1 diabetes with elevated levels of ZMT 8 antibodies as well as glutamic acid decarboxylase antibodies.  Patient was diagnosed at approximate age of 18 years when he weighed 220 pounds.  He was always treated as type 1 diabetes.  He did have 1 episode of DKA in 2010 when his blood glucose rose to 1280.   He made significant changes in his lifestyle and presents with significant improvement in his glycemic profile.  He wears t:slim tandem source showing a summary of 63% time in range, 26% level 1 hyperglycemia, 11% level 2 hyperglycemia.  This is a sharp improvement from his previous 2 weeks when he had 53% time range, 32% level 1 hyperglycemia, 14% level 2 hyperglycemia.  He did not have significant hypoglycemia.  He wears t:slim insulin pump with rapid acting insulin settings 1.9 units/h for basal, ICR 1:2, target glucose of 110, IAT5 hours, ISFr 1:23. His recent A1c was  8.4%.  - I had a long discussion with him about the possible risk factors and  the pathology behind its  diabetes and its complications. Patient needs further workup to classify his diabetes-see below. -his diabetes is complicated by non proliferative retinopathy, high BMI of 31.65.    he remains at a high risk for more acute and chronic complications which include CAD, CVA, CKD, retinopathy, and neuropathy. These are all discussed in detail with him.  - I discussed all available options of managing his diabetes including de-escalation of medications. I have counseled him on Food as Medicine by adopting a Whole Food , Plant Predominant  ( WFPP) nutrition as recommended by [employer] of Lifestyle Medicine. Patient is encouraged to switch to  unprocessed or minimally processed  complex starch, adequate protein intake (mainly plant source), minimal liquid fat, plenty of fruits, and vegetables. -  he is advised to stick to a routine mealtimes to eat 3 complete meals a day and snack only when necessary ( to snack only to correct hypoglycemia BG <70 day time or <100 at night).   He is still a great candidate for lifestyle medicine. - he acknowledges that there is a room for improvement in his food and drink choices. - Suggestion is made for him to avoid simple carbohydrates  from his diet including Cakes, Sweet Desserts, Ice Cream, Soda (diet and regular), Sweet Tea, Candies, Chips, Cookies, Store Bought Juices, Alcohol , Artificial Sweeteners,  Coffee Creamer, and "Sugar-free" Products, Lemonade. This will help patient to have more stable blood glucose profile and potentially avoid unintended weight gain.  The following Lifestyle Medicine recommendations according to American College of Lifestyle Medicine  [facility]) were discussed and and offered to patient and he  agrees to start the journey:  A. Whole Foods, Plant-Based Nutrition comprising of fruits and vegetables, plant-based proteins, whole-grain  carbohydrates was discussed in detail with the patient.   A list for source of those nutrients were also provided to the patient.  Patient will use only water or unsweetened tea for hydration. B.  The need to stay away from risky substances including alcohol, smoking; obtaining 7 to 9 hours of restorative sleep, at least 150 minutes of moderate intensity exercise weekly, the importance of healthy social connections,  and stress management techniques were discussed. C.  A full color page of  Calorie density of various food groups per pound showing examples of each food groups was provided to the patient.  -I have reviewed and kept him on the same insulin pump settings, see above.  However, there seems to be a gap in carbs counting as he is tandem source reports average daily carbs consumption of 38 g which is significantly lower than average.  Also, his device is show 11 bolusing attempts daily.  He will benefit from diabetes education refresher. - he will be scheduled with Norm Salt, RDN, CDE for individualized diabetes education. -Given his positive workup for type 1 diabetes, he does not have non-insulin treatment options.  He is encouraged to use his Dexcom CGM.  He is advised to call clinic for hypoglycemia under 70 or hyperglycemia above 200 mg per DL.  - Specific targets for  A1c;  LDL, HDL,  and Triglycerides were discussed with the patient.  2) Blood Pressure /Hypertension: His blood pressure is controlled to target.  he is currently on lisinopril 5 mg p.o. daily.    3) Lipids/Hyperlipidemia:   Review of his recent lipid panel showed controlled  LDL at 72 .  he  is advised to continue Lipitor 80 mg p.o. daily at bedtime.   Side effects and  precautions discussed with him.  4)  Weight/Diet:  Body mass index is 31.65 kg/m.  -   clearly complicating his diabetes care.  He wishes to achieve appropriate weight loss.  We discussed on some goals.   he is  a candidate for weight loss. I  discussed with him the fact that loss of 5 - 10% of his  current body weight will have the most impact on his diabetes management.  The above detailed  ACLM recommendations for nutrition, exercise, sleep, social life, avoidance of risky substances, the need for restorative sleep   information will also detailed on discharge instructions.  5) Chronic Care/Health Maintenance:  -he  is on ACEI/ARB and Statin medications and  is encouraged to initiate and continue to follow up with Ophthalmology, Dentist,  Podiatrist at least yearly or according to recommendations, and advised to   stay away from smoking. I have recommended yearly flu vaccine and pneumonia vaccine at least every 5 years; moderate intensity exercise for up to 150 minutes weekly; and  sleep for 7- 9 hours a day.  His DM Foot Exam is normal on December 16, 2022. He will also benefit from vitamin D supplement, 2000 units daily.  - he is  advised to maintain close follow up with Park Meo, FNP for primary care needs, as well as his other providers for optimal and coordinated care.  I spent  26  minutes in the care of the patient today including review of labs from CMP, Lipids, Thyroid Function, Hematology (current and previous including abstractions from other facilities); face-to-face time discussing  his blood glucose readings/logs, discussing hypoglycemia and hyperglycemia episodes and symptoms, medications doses, his options of short and long term treatment based on the latest standards of care / guidelines;  discussion about incorporating lifestyle medicine;  and documenting the encounter. Risk reduction counseling performed per USPSTF guidelines to reduce  obesity and cardiovascular risk factors.     Please refer to Patient Instructions for Blood Glucose Monitoring and Insulin/Medications Dosing Guide"  in media tab for additional information. Please  also refer to " Patient Self Inventory" in the Media  tab for reviewed elements of  pertinent patient history.  Thomas Ramsey participated in the discussions, expressed understanding, and voiced agreement with the above plans.  All questions were answered to his satisfaction. he is encouraged to contact clinic should he have any questions or concerns prior to his return visit.   Follow up plan: - Return in about 3 months (around 04/01/2023) for Bring Meter/CGM Device/Logs- A1c in Office.  Marquis Lunch, MD [facility] [address] [place], [place] [address] Phone: [phone]  Fax: [phone]    12/31/2022, 6:38 PM  This note was partially dictated with voice recognition software. Similar sounding words can be transcribed inadequately or may not  be corrected upon review.

## 2023-01-19 ENCOUNTER — Encounter: Payer: Self-pay | Admitting: Family Medicine

## 2023-01-19 ENCOUNTER — Ambulatory Visit: Payer: PRIVATE HEALTH INSURANCE | Admitting: Family Medicine

## 2023-01-19 VITALS — BP 132/82 | HR 84 | Temp 98.5°F | Ht 72.0 in | Wt 236.0 lb

## 2023-01-19 DIAGNOSIS — B349 Viral infection, unspecified: Secondary | ICD-10-CM | POA: Diagnosis not present

## 2023-01-19 LAB — INFLUENZA A AND B AG, IMMUNOASSAY
INFLUENZA A ANTIGEN: NOT DETECTED
INFLUENZA B ANTIGEN: NOT DETECTED

## 2023-01-19 NOTE — Patient Instructions (Signed)

## 2023-01-19 NOTE — Progress Notes (Signed)
Acute Office Visit  Subjective:     Patient ID: Thomas Ramsey, male    DOB: 1981-03-30, 42 y.o.   MRN: [id number]  Chief Complaint  Patient presents with   Follow-up    possible allergies/coughing/wheezing/body aches  At home Covid-test is negative    HPI Patient is in today for 3 days of dry coughing, wheezing, body aches, sinus pressure, ear pain, temp 100.4 yesterday, itching eyes Denies rhinorrhea, congestion, N/V/D Home covid negative, exposed to several people at work who hae viral illnesses Has tried Tylenol Symptoms overall improving  Review of Systems  All other systems reviewed and are negative.       Objective:    BP 132/82   Pulse 84   Temp 98.5 F (36.9 C) (Oral)   Ht 6' (1.829 m)   Wt 236 lb (107 kg)   SpO2 97%   BMI 32.01 kg/m    Physical Exam Vitals and nursing note reviewed.  Constitutional:      Appearance: Normal appearance. He is normal weight.  HENT:     Head: Normocephalic and atraumatic.     Right Ear: Tympanic membrane, ear canal and external ear normal.     Left Ear: Tympanic membrane, ear canal and external ear normal.     Nose:     Right Sinus: Frontal sinus tenderness present. No maxillary sinus tenderness.     Left Sinus: Frontal sinus tenderness present. No maxillary sinus tenderness.     Mouth/Throat:     Mouth: Mucous membranes are moist.     Pharynx: Oropharynx is clear.  Eyes:     Extraocular Movements: Extraocular movements intact.     Conjunctiva/sclera: Conjunctivae normal.     Pupils: Pupils are equal, round, and reactive to light.  Cardiovascular:     Rate and Rhythm: Normal rate and regular rhythm.     Pulses: Normal pulses.     Heart sounds: Normal heart sounds.  Pulmonary:     Effort: Pulmonary effort is normal.     Breath sounds: Normal breath sounds.  Musculoskeletal:     Cervical back: No tenderness.  Lymphadenopathy:     Cervical: No cervical adenopathy.  Skin:    General: Skin is warm and dry.      Capillary Refill: Capillary refill takes less than 2 seconds.  Neurological:     General: No focal deficit present.     Mental Status: He is alert and oriented to person, place, and time. Mental status is at baseline.  Psychiatric:        Mood and Affect: Mood normal.        Behavior: Behavior normal.        Thought Content: Thought content normal.        Judgment: Judgment normal.     Results for orders placed or performed in visit on 01/19/23  Influenza A and B Ag, Immunoassay  Result Value Ref Range   Source: NASAL    INFLUENZA A ANTIGEN NOT DETECTED NOT DETECTED   INFLUENZA B ANTIGEN NOT DETECTED NOT DETECTED        Assessment & Plan:   Problem List Items Addressed This Visit     Viral illness - Primary    Reassured patient that symptoms and exam findings are most consistent with a viral upper respiratory infection and explained lack of efficacy of antibiotics against viruses.  Discussed expected course and features suggestive of secondary bacterial infection.  Continue supportive care. Increase fluid intake with water  or electrolyte solution like pedialyte. Encouraged acetaminophen as needed for fever/pain. Encouraged salt water gargling, chloraseptic spray and throat lozenges. Encouraged OTC guaifenesin. Encouraged saline sinus flushes and/or neti with humidified air.        Relevant Orders   Influenza A and B Ag, Immunoassay (Completed)    No orders of the defined types were placed in this encounter.   Return if symptoms worsen or fail to improve.  Park Meo, FNP

## 2023-01-19 NOTE — Assessment & Plan Note (Signed)

## 2023-01-21 ENCOUNTER — Ambulatory Visit: Payer: PRIVATE HEALTH INSURANCE | Admitting: Family Medicine

## 2023-01-21 ENCOUNTER — Encounter: Payer: Self-pay | Admitting: Family Medicine

## 2023-01-21 VITALS — BP 118/82 | HR 79 | Temp 97.8°F | Ht 73.0 in | Wt 234.0 lb

## 2023-01-21 DIAGNOSIS — J019 Acute sinusitis, unspecified: Secondary | ICD-10-CM | POA: Diagnosis not present

## 2023-01-21 DIAGNOSIS — B9689 Other specified bacterial agents as the cause of diseases classified elsewhere: Secondary | ICD-10-CM

## 2023-01-21 MED ORDER — AMOXICILLIN-POT CLAVULANATE 875-125 MG PO TABS: 1.0000 | ORAL_TABLET | Freq: Two times a day (BID) | ORAL | 0 refills | Status: DC

## 2023-01-21 NOTE — Assessment & Plan Note (Signed)
Symptoms consistent with viral URI progressed to acute bacterial sinus infection. Start Augmentin 875-125mg  BID for 7d. Continue symptomatic management with Tylenol. Return to office if symptoms persist or worsen.

## 2023-01-21 NOTE — Progress Notes (Signed)
   Acute Office Visit  Subjective:     Patient ID: Thomas Ramsey, male    DOB: 1981-07-04, 42 y.o.   MRN: [id number]  Chief Complaint  Patient presents with   Acute Visit    sinus pressure still going on after 2 days/slj     HPI Patient is in today for 6 days of ongoing left sided sinus pressure, left sided facial pain, congestion, headache, ear ache. He was seen recently for viral URI, not antibiotics. Denies fever, rhinorrhea, cough, shortness of breath. Has tried Tylenol Negative covid and flu  Review of Systems  All other systems reviewed and are negative.       Objective:    BP 118/82   Pulse 79   Temp 97.8 F (36.6 C) (Oral)   Ht 6\' 1"  (1.854 m)   Wt 234 lb (106.1 kg)   SpO2 96%   BMI 30.87 kg/m    Physical Exam Vitals and nursing note reviewed.  Constitutional:      Appearance: Normal appearance. He is normal weight.  HENT:     Head: Normocephalic and atraumatic.     Right Ear: Tympanic membrane, ear canal and external ear normal.     Left Ear: Tympanic membrane, ear canal and external ear normal.     Nose:     Left Sinus: Maxillary sinus tenderness and frontal sinus tenderness present.  Cardiovascular:     Pulses: Normal pulses.     Heart sounds: Normal heart sounds.  Pulmonary:     Effort: Pulmonary effort is normal.     Breath sounds: Normal breath sounds.  Musculoskeletal:     Cervical back: No tenderness.  Lymphadenopathy:     Cervical: No cervical adenopathy.  Skin:    General: Skin is warm and dry.     Capillary Refill: Capillary refill takes less than 2 seconds.  Neurological:     General: No focal deficit present.     Mental Status: He is alert and oriented to person, place, and time. Mental status is at baseline.  Psychiatric:        Mood and Affect: Mood normal.        Behavior: Behavior normal.        Thought Content: Thought content normal.        Judgment: Judgment normal.     No results found for any visits on  01/21/23.      Assessment & Plan:   Problem List Items Addressed This Visit     Acute bacterial sinusitis - Primary    Symptoms consistent with viral URI progressed to acute bacterial sinus infection. Start Augmentin 875-125mg  BID for 7d. Continue symptomatic management with Tylenol. Return to office if symptoms persist or worsen.      Relevant Medications   amoxicillin-clavulanate (AUGMENTIN) 875-125 MG tablet    Meds ordered this encounter  Medications   amoxicillin-clavulanate (AUGMENTIN) 875-125 MG tablet    Sig: Take 1 tablet by mouth 2 (two) times daily.    Dispense:  20 tablet    Refill:  0    Order Specific Question:   Supervising Provider    Answer:   Lynnea Ferrier T [[id number]]    Return if symptoms worsen or fail to improve.  Park Meo, FNP

## 2023-02-02 ENCOUNTER — Other Ambulatory Visit: Payer: Self-pay

## 2023-02-02 ENCOUNTER — Encounter: Payer: Self-pay | Admitting: "Endocrinology

## 2023-02-02 ENCOUNTER — Encounter: Payer: Self-pay | Admitting: Family Medicine

## 2023-02-02 DIAGNOSIS — E103293 Type 1 diabetes mellitus with mild nonproliferative diabetic retinopathy without macular edema, bilateral: Secondary | ICD-10-CM

## 2023-02-02 MED ORDER — DEXCOM G6 TRANSMITTER MISC: 1 refills | Status: DC

## 2023-02-03 ENCOUNTER — Encounter: Payer: PRIVATE HEALTH INSURANCE | Attending: Family Medicine | Admitting: Nutrition

## 2023-02-03 VITALS — Ht 72.0 in | Wt 236.0 lb

## 2023-02-03 DIAGNOSIS — E782 Mixed hyperlipidemia: Secondary | ICD-10-CM | POA: Diagnosis present

## 2023-02-03 DIAGNOSIS — I10 Essential (primary) hypertension: Secondary | ICD-10-CM | POA: Diagnosis present

## 2023-02-03 DIAGNOSIS — E1065 Type 1 diabetes mellitus with hyperglycemia: Secondary | ICD-10-CM

## 2023-02-03 NOTE — Progress Notes (Signed)
Medical Nutrition Therapy  Appointment Start time:  1530  Appointment End time:  1630  Primary concerns today: Type 1 Diabetic Referral diagnosis: E10.65 Preferred learning style: No preference  Learning readiness: Ready   NUTRITION ASSESSMENT  42 yr old wmale here for diabetes education refresher on carb counting to help improve his DM Control. Has had DM Type 1 for > 20 yrs. 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 Has a brand new T Slim pump. His old one malfunctioned and had his new one overnighted 2 days ago. He has the Dexcom 6. He hasn't met with a pump rep in a long time Last A1C was 8.4%, down from > 9%.  PCP Kurtis Bushman. His new Endocrinologist is Dr. Fransico Him in [place], [place].  Basal settings are 1.8. Insulin/Carb ratio is 1:2. Dr. Fransico Him had him adjust it to 1:10 instead. Ratio of 1:2 has made it hard for him to put in the correct carb intake and get the right amount of insulin to improve his BS.  He is willing to work on eating meals more consistently, more healthier foods overall and work on carb counting.   Anthropometrics  Wt Readings from Last 3 Encounters:  01/21/23 234 lb (106.1 kg)  01/19/23 236 lb (107 kg)  12/31/22 236 lb 9.6 oz (107.3 kg)   Ht Readings from Last 3 Encounters:  01/21/23 6\' 1"  (1.854 m)  01/19/23 6' (1.829 m)  12/31/22 6' 0.5" (1.842 m)   There is no height or weight on file to calculate BMI. @BMIFA @ Facility age limit for growth %iles is 20 years. Facility age limit for growth %iles is 20 years.    Clinical Medical Hx: See chart Medications: 1.9 basal    correctiron is 1.23,  carbs 1:2--Changed today to 1:10,  BG Goal 110.    Labs:  Lab Results  Component Value Date   HGBA1C 8.4 (H) 11/26/2022      Latest Ref Rng & Units 11/26/2022    8:37 AM 04/17/2021   11:47 AM 01/19/2020    8:34 AM  CMP  Glucose 65 - 99 mg/dL 161  096  045   BUN 7 - 25 mg/dL 16  15  15    Creatinine 0.60 - 1.29 mg/dL 4.09  8.11  9.14   Sodium 135  - 146 mmol/L 138  141  137   Potassium 3.5 - 5.3 mmol/L 4.7  4.7  5.6   Chloride 98 - 110 mmol/L 102  103  101   CO2 20 - 32 mmol/L 27  27  27    Calcium 8.6 - 10.3 mg/dL 9.2  9.3  9.6   Total Protein 6.1 - 8.1 g/dL 6.5  6.9  6.6   Total Bilirubin 0.2 - 1.2 mg/dL 0.6  0.5  0.6   AST 10 - 40 U/L 23  19  18    ALT 9 - 46 U/L 35  27  27    Lipid Panel     Component Value Date/Time   CHOL 131 11/26/2022 0837   TRIG 148 11/26/2022 0837   HDL 36 (L) 11/26/2022 0837   CHOLHDL 3.6 11/26/2022 0837   LDLCALC 72 11/26/2022 0837    Notable Signs/Symptoms: fatigue, increased urination,   Lifestyle & Dietary Hx Lives with himself  Eats most foods at home.  Estimated daily fluid intake: 32 oz Supplements: none Sleep: 8 Stress / self-care:  Current average weekly physical activity: Workouts 3 times per week.   24-Hr Dietary Recall First  Meal: 9 am banana Snack:  Second Meal: [place] and cheese with let/tom sandwich on whole wheat bread with protein bar, diet dr. Reino Kent Snack: apple Third Meal: 730 shrimp scampi with pasta-2 cups; 8 jumbo shrimp,  Dt Dr. Reino Kent. Snack: none Beverages: Diet sodas, water,   Estimated Energy Needs Calories: 1800-2000 Carbohydrate: 200g Protein: 135g Fat: 56g   NUTRITION DIAGNOSIS  NB-1.1 Food and nutrition-related knowledge deficit As related to Diabetes Type 1.  As evidenced by A1C 8.4%.   NUTRITION INTERVENTION  Nutrition education (E-1) on the following topics:  Nutrition and Diabetes education provided on My Plate, CHO counting, meal planning, portion sizes, timing of meals, avoiding snacks between meals unless having a low blood sugar, target ranges for A1C and blood sugars, signs/symptoms and treatment of hyper/hypoglycemia, monitoring blood sugars, taking medications as prescribed, benefits of exercising 30 minutes per day and prevention of complications of DM. Lifestyle Medicine  - Whole Food, Plant Predominant Nutrition is highly  recommended: Eat Plenty of vegetables, Mushrooms, fruits, Legumes, Whole Grains, Nuts, seeds in lieu of processed meats, processed snacks/pastries red meat, poultry, eggs.    -It is better to avoid simple carbohydrates including: Cakes, Sweet Desserts, Ice Cream, Soda (diet and regular), Sweet Tea, Candies, Chips, Cookies, Store Bought Juices, Alcohol in Excess of  1-2 drinks a day, Lemonade,  Artificial Sweeteners, Doughnuts, Coffee Creamers, "Sugar-free" Products, etc, etc.  This is not a complete list.....  Exercise: If you are able: 30 -60 minutes a day ,4 days a week, or 150 minutes a week.  The longer the better.  Combine stretch, strength, and aerobic activities.  If you were told in the past that you have high risk for cardiovascular diseases, you may seek evaluation by your heart doctor prior to initiating moderate to intense exercise programs.   Handouts Provided Include  Lifestyle Medicine Meal Plan Card  Learning Style & Readiness for Change Teaching method utilized: Visual & Auditory  Demonstrated degree of understanding via: Teach Back  Barriers to learning/adherence to lifestyle change: None  Goals Established by Pt Goals  Eat 45-60 g carbs at each meal. Increase complex Carbs of fruits, vegetebles and whole grains with meals Cut down on processed and fast foods Drink only water and cut down on Diet Dr. Kathreen Cosier' skip meals Will have pump rep call to discuss pump settings and resources to help with improving blood sugar control Exercise 30 minutes a day Use the carb:insulin ratio and put in the correct amount of carbs per meal to get improved blood sugar  control. Get A1c to 7.5% or less    MONITORING & EVALUATION Dietary intake, weekly physical activity, and blood sugars in 1 month.  Next Steps  Patient is to work on consistent CHO intake with meals 3 times per day.Marland Kitchen

## 2023-02-05 ENCOUNTER — Other Ambulatory Visit: Payer: Self-pay | Admitting: Family Medicine

## 2023-02-08 ENCOUNTER — Encounter: Payer: Self-pay | Admitting: Nutrition

## 2023-02-08 ENCOUNTER — Encounter: Payer: Self-pay | Admitting: Family Medicine

## 2023-02-08 ENCOUNTER — Other Ambulatory Visit: Payer: Self-pay | Admitting: Family Medicine

## 2023-02-08 NOTE — Patient Instructions (Signed)
Goals  Eat 45-60 g carbs at each meal. Increase complex Carbs of fruits, vegetebles and whole grains with meals Cut down on processed and fast foods Drink only water and cut down on Diet Dr. Kathreen Cosier' skip meals Will have pump rep call to discuss pump settings and resources to help with improving blood sugar control Exercise 30 minutes a day Use the carb:insulin ratio and put in the correct amount of carbs per meal to get improved blood sugar  control. Get A1c to 7.5% or less

## 2023-02-09 ENCOUNTER — Encounter: Payer: Self-pay | Admitting: Family Medicine

## 2023-02-09 ENCOUNTER — Ambulatory Visit: Payer: PRIVATE HEALTH INSURANCE | Admitting: Family Medicine

## 2023-02-09 VITALS — BP 130/80 | HR 71 | Temp 97.8°F | Ht 73.0 in | Wt 237.0 lb

## 2023-02-09 DIAGNOSIS — J069 Acute upper respiratory infection, unspecified: Secondary | ICD-10-CM | POA: Insufficient documentation

## 2023-02-09 NOTE — Patient Instructions (Signed)

## 2023-02-09 NOTE — Progress Notes (Signed)
   Acute Office Visit  Subjective:     Patient ID: Thomas Ramsey, male    DOB: 1981-06-06, 42 y.o.   MRN: [id number]  Chief Complaint  Patient presents with   Follow-up    Sunday fever, 101.3, wheezing, sneezing    HPI Patient is in today for 4 days of fever, wheezing, sneezing, chest congestion, and head pressure. He was sick earlier this month and his symptoms completely resolved with full course of Augmentin. He started feeling ill again after working in the rain 4 days ago however he has not had a fever in the last 24 hours and symptoms are overall improving. Denies pleurisy, shortness of breath, wheezing, fatigue, body aches/chills. He has been using Tylenol.  Review of Systems  All other systems reviewed and are negative.       Objective:    BP 130/80   Pulse 71   Temp 97.8 F (36.6 C) (Oral)   Ht 6\' 1"  (1.854 m)   Wt 237 lb (107.5 kg)   SpO2 97%   BMI 31.27 kg/m    Physical Exam Vitals and nursing note reviewed.  Constitutional:      Appearance: Normal appearance. He is normal weight.  HENT:     Head: Normocephalic and atraumatic.     Right Ear: Tympanic membrane, ear canal and external ear normal.     Left Ear: Tympanic membrane, ear canal and external ear normal.     Nose:     Right Sinus: Frontal sinus tenderness present.     Left Sinus: Frontal sinus tenderness present.     Mouth/Throat:     Pharynx: Oropharynx is clear.  Eyes:     Conjunctiva/sclera: Conjunctivae normal.  Cardiovascular:     Rate and Rhythm: Normal rate and regular rhythm.     Pulses: Normal pulses.     Heart sounds: Normal heart sounds.  Pulmonary:     Effort: Pulmonary effort is normal.     Breath sounds: Normal breath sounds.  Skin:    General: Skin is warm and dry.     Capillary Refill: Capillary refill takes less than 2 seconds.  Neurological:     General: No focal deficit present.     Mental Status: He is alert and oriented to person, place, and time. Mental status  is at baseline.  Psychiatric:        Mood and Affect: Mood normal.        Behavior: Behavior normal.        Thought Content: Thought content normal.        Judgment: Judgment normal.     No results found for any visits on 02/09/23.      Assessment & Plan:   Problem List Items Addressed This Visit     Viral URI - Primary    Reassured patient that symptoms and exam findings are most consistent with a viral upper respiratory infection and explained lack of efficacy of antibiotics against viruses.  Discussed expected course and features suggestive of secondary bacterial infection.  Continue supportive care. Increase fluid intake with water or electrolyte solution like pedialyte. Encouraged acetaminophen as needed for fever/pain. Encouraged salt water gargling, chloraseptic spray and throat lozenges. Encouraged OTC guaifenesin. Encouraged saline sinus flushes and/or neti with humidified air.         No orders of the defined types were placed in this encounter.   Return if symptoms worsen or fail to improve.  Park Meo, FNP

## 2023-02-09 NOTE — Assessment & Plan Note (Signed)

## 2023-03-14 ENCOUNTER — Other Ambulatory Visit: Payer: Self-pay | Admitting: Family Medicine

## 2023-03-14 DIAGNOSIS — E103293 Type 1 diabetes mellitus with mild nonproliferative diabetic retinopathy without macular edema, bilateral: Secondary | ICD-10-CM

## 2023-03-19 ENCOUNTER — Encounter: Payer: Self-pay | Admitting: "Endocrinology

## 2023-03-19 ENCOUNTER — Encounter: Payer: Self-pay | Admitting: Family Medicine

## 2023-03-19 ENCOUNTER — Other Ambulatory Visit: Payer: Self-pay

## 2023-03-19 DIAGNOSIS — E103293 Type 1 diabetes mellitus with mild nonproliferative diabetic retinopathy without macular edema, bilateral: Secondary | ICD-10-CM

## 2023-03-19 MED ORDER — HUMALOG 100 UNIT/ML IJ SOLN
INTRAMUSCULAR | 3 refills | Status: DC
Start: 2023-03-19 — End: 2023-09-30

## 2023-04-02 ENCOUNTER — Ambulatory Visit: Payer: PRIVATE HEALTH INSURANCE | Admitting: "Endocrinology

## 2023-05-18 ENCOUNTER — Encounter: Payer: Self-pay | Admitting: "Endocrinology

## 2023-05-18 ENCOUNTER — Other Ambulatory Visit: Payer: Self-pay

## 2023-05-18 ENCOUNTER — Ambulatory Visit: Payer: No Typology Code available for payment source | Admitting: "Endocrinology

## 2023-05-18 VITALS — BP 126/54 | HR 64 | Ht 73.0 in | Wt 237.2 lb

## 2023-05-18 DIAGNOSIS — E103293 Type 1 diabetes mellitus with mild nonproliferative diabetic retinopathy without macular edema, bilateral: Secondary | ICD-10-CM | POA: Diagnosis not present

## 2023-05-18 DIAGNOSIS — E6609 Other obesity due to excess calories: Secondary | ICD-10-CM | POA: Diagnosis not present

## 2023-05-18 DIAGNOSIS — E559 Vitamin D deficiency, unspecified: Secondary | ICD-10-CM

## 2023-05-18 DIAGNOSIS — E782 Mixed hyperlipidemia: Secondary | ICD-10-CM

## 2023-05-18 DIAGNOSIS — Z6831 Body mass index (BMI) 31.0-31.9, adult: Secondary | ICD-10-CM

## 2023-05-18 LAB — POCT GLYCOSYLATED HEMOGLOBIN (HGB A1C): HbA1c, POC (controlled diabetic range): 7.6 % — AB (ref 0.0–7.0)

## 2023-05-18 MED ORDER — DEXCOM G6 TRANSMITTER MISC
1 refills | Status: AC
Start: 2023-05-18 — End: ?

## 2023-05-18 NOTE — Progress Notes (Signed)
Endocrinology Consult Note       05/18/2023, 7:12 PM   Subjective:    Patient ID: Thomas Ramsey, male    DOB: 10/12/1980.  Thomas Ramsey is being seen in consultation for management of currently uncontrolled symptomatic diabetes requested by  Park Meo, FNP.   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 in thumbs or great toes   Type I (juvenile type) diabetes mellitus without mention of complication, not stated as uncontrolled     Past Surgical History:  Procedure Laterality Date   FINGER SURGERY  1984   Reattached Index finger (right hand)    TONSILLECTOMY  1992   TYMPANOSTOMY TUBE PLACEMENT  1984    Social History   Socioeconomic History   Marital status: Single    Spouse name: Not on file   Number of children: Not on file   Years of education: Not on file   Highest education level: 12th grade  Occupational History    Employer: [employer]  Tobacco Use   Smoking status: Never   Smokeless tobacco: Never  Vaping Use   Vaping status: Never Used  Substance and Sexual Activity   Alcohol use: No   Drug use: No   Sexual activity: Yes  Other Topics Concern   Not on file  Social History Narrative   Not on file   Social Determinants of Health   Financial Resource Strain: Low Risk  (01/19/2023)   Overall Financial Resource Strain (CARDIA)    Difficulty of Paying Living Expenses: Not hard at all  Food Insecurity: No Food Insecurity (01/19/2023)   Hunger Vital Sign    Worried About Running Out of Food in the Last Year: Never true    Ran Out of Food in the Last Year: Never true  Transportation Needs: No Transportation Needs (01/19/2023)   PRAPARE - Administrator, Civil Service (Medical): No    Lack of Transportation (Non-Medical): No  Physical Activity: Sufficiently Active (01/19/2023)    Exercise Vital Sign    Days of Exercise per Week: 4 days    Minutes of Exercise per Session: 40 min  Stress: No Stress Concern Present (01/19/2023)   [employer] of Occupational Health - Occupational Stress Questionnaire    Feeling of Stress : Not at all  Social Connections: Moderately Isolated (01/19/2023)   Social Connection and Isolation Panel [NHANES]    Frequency of Communication with Friends and Family: More than three times a week    Frequency of Social Gatherings with Friends and Family: More than three times a week    Attends Religious Services: More than 4 times per year    Active Member of [employer] or Organizations: No    Attends Engineer, structural: Not on file    Marital Status: Never married    Family History  Problem Relation Age of Onset   Thyroid disease Mother    Hyperlipidemia Mother    Cancer Father    Hyperlipidemia Father    Hypertension Father    Diabetes Neg  Hx     Outpatient Encounter Medications as of 05/18/2023  Medication Sig   atorvastatin (LIPITOR) 80 MG tablet Take 1 tablet (80 mg total) by mouth daily.   Cholecalciferol (VITAMIN D) 50 MCG (2000 UT) CAPS Take 1 capsule (2,000 Units total) by mouth daily with breakfast.   Continuous Blood Gluc Receiver (DEXCOM G6 RECEIVER) DEVI USE AS DIRECTED FOR CONTINUOUS GLUCOSE MONITORING   Continuous Blood Gluc Sensor (DEXCOM G6 SENSOR) MISC SMARTSIG:1 Topical Every 10 Days   HUMALOG 100 UNIT/ML injection INJECT 0-100 UNITS SUBCUTANEOUSLY DAILY AS DIRECTED   HUMALOG 100 UNIT/ML injection SMARTSIG:0-100 Unit(s) SUB-Q Daily   lisinopril (ZESTRIL) 5 MG tablet Take 1 tablet by mouth once daily   Tadalafil 2.5 MG TABS TAKE 1 TABLET BY MOUTH ONCE DAILY AS NEEDED FOR ERECTILE DYSFUNCTION   [DISCONTINUED] Continuous Glucose Transmitter (DEXCOM G6 TRANSMITTER) MISC USE AS DIRECTED FOR CONTINUOUS GLUCOSE MONITORING. REUSE TRAINSMITTER FOR 90 DAYS THEN DISCARD AND REPALCE   No facility-administered encounter  medications on file as of 05/18/2023.    ALLERGIES: No Known Allergies  VACCINATION STATUS: Immunization History  Administered Date(s) Administered   Hep B, Unspecified 06/20/2019   Hepatitis B 06/20/2019   Influenza Split 08/01/2013   Influenza,inj,Quad PF,6+ Mos 06/16/2019   Influenza-Unspecified 08/01/2013, 01/22/2021   PFIZER(Purple Top)SARS-COV-2 Vaccination 11/11/2019, 12/05/2019   PPD Test 01/12/2022   Pneumococcal Polysaccharide-23 04/17/2014   Tdap 03/23/2016    Diabetes He presents for his follow-up diabetic visit. He has type 1 (?  Patient was diagnosed at approximate age of 18 years when he weighed 220 pounds.  He did have 1 epepisode of DKA when his blood glucose rose to 1280.  Patient does not recall if he ever had antibodies tested to classify his diabetes.) diabetes mellitus. His disease course has been improving. There are no hypoglycemic associated symptoms. Pertinent negatives for hypoglycemia include no confusion, headaches, pallor or seizures. Pertinent negatives for diabetes include no chest pain, no fatigue, no polydipsia, no polyphagia, no polyuria and no weakness. There are no hypoglycemic complications. Symptoms are improving. Diabetic complications include impotence and retinopathy. Risk factors for coronary artery disease include diabetes mellitus, dyslipidemia, male sex, obesity and family history. Current diabetic treatment includes insulin pump. His weight is decreasing steadily (He presents with 6 pounds of weight loss since last visit.). He is following a generally unhealthy diet. When asked about meal planning, he reported none. He has not had a previous visit with a dietitian. He participates in exercise intermittently. His home blood glucose trend is decreasing steadily. His breakfast blood glucose range is generally 140-180 mg/dl. His lunch blood glucose range is generally 140-180 mg/dl. His dinner blood glucose range is generally 140-180 mg/dl. His bedtime  blood glucose range is generally 140-180 mg/dl. His overall blood glucose range is 140-180 mg/dl. (He made significant changes in his lifestyle and presents with significant improvement in his glycemic profile.  He wears t:slim tandem with Dexcom showing a summary of 65% time in range, 25% level 1 hyperglycemia , 6% level 2 hypoglycemia.  His point-of-care A1c 7.6%, progressively improving from 8.4%.   He did not have significant hypoglycemia.  He wears t:slim insulin pump with rapid acting insulin settings 1.9 units/h for basal, ICR 1: 10, target glucose of 110, IAT5 hours, ISF: 1:23. ) An ACE inhibitor/angiotensin II receptor blocker is being taken.  Hyperlipidemia This is a chronic problem. The current episode started more than 1 year ago. The problem is controlled. Pertinent negatives include no chest pain, myalgias  or shortness of breath. Current antihyperlipidemic treatment includes statins. Risk factors for coronary artery disease include diabetes mellitus, dyslipidemia, male sex and family history.    Review of Systems  Constitutional:  Negative for chills, fatigue, fever and unexpected weight change.  HENT:  Negative for dental problem, mouth sores and trouble swallowing.   Eyes:  Negative for visual disturbance.  Respiratory:  Negative for cough, choking, chest tightness, shortness of breath and wheezing.   Cardiovascular:  Negative for chest pain, palpitations and leg swelling.  Gastrointestinal:  Negative for abdominal distention, abdominal pain, constipation, diarrhea, nausea and vomiting.  Endocrine: Negative for polydipsia, polyphagia and polyuria.  Genitourinary:  Positive for impotence. Negative for dysuria, flank pain, hematuria and urgency.  Musculoskeletal:  Negative for back pain, gait problem, myalgias and neck pain.  Skin:  Negative for pallor, rash and wound.  Neurological:  Negative for seizures, syncope, weakness, numbness and headaches.  Psychiatric/Behavioral: Negative.   Negative for confusion and dysphoric mood.     Objective:       05/18/2023    2:58 PM 02/09/2023   11:24 AM 02/03/2023    3:24 PM  Vitals with BMI  Height 6\' 1"  6\' 1"  6\' 0"   Weight 237 lbs 3 oz 237 lbs 236 lbs  BMI 31.3 31.27 32  Systolic 126 130   Diastolic 54 80   Pulse 64 71     BP (!) 126/54   Pulse 64   Ht 6\' 1"  (1.854 m)   Wt 237 lb 3.2 oz (107.6 kg)   BMI 31.29 kg/m   Wt Readings from Last 3 Encounters:  05/18/23 237 lb 3.2 oz (107.6 kg)  02/09/23 237 lb (107.5 kg)  02/03/23 236 lb (107 kg)      CMP     Component Value Date/Time   NA 138 11/26/2022 0837   K 4.7 11/26/2022 0837   CL 102 11/26/2022 0837   CO2 27 11/26/2022 0837   GLUCOSE 272 (H) 11/26/2022 0837   BUN 16 11/26/2022 0837   CREATININE 1.00 11/26/2022 0837   CALCIUM 9.2 11/26/2022 0837   PROT 6.5 11/26/2022 0837   AST 23 11/26/2022 0837   ALT 35 11/26/2022 0837   BILITOT 0.6 11/26/2022 0837   GFRNONAA 103 06/09/2018 1004   GFRAA 120 06/09/2018 1004    Diabetic Labs (most recent): Lab Results  Component Value Date   HGBA1C 7.6 (A) 05/18/2023   HGBA1C 8.4 (H) 11/26/2022   HGBA1C 7.7 (A) 11/26/2021     Lipid Panel ( most recent) Lipid Panel     Component Value Date/Time   CHOL 131 11/26/2022 0837   TRIG 148 11/26/2022 0837   HDL 36 (L) 11/26/2022 0837   CHOLHDL 3.6 11/26/2022 0837   LDLCALC 72 11/26/2022 0837     Assessment & Plan:   1. 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  He made significant changes in his lifestyle and presents with significant improvement in his glycemic profile.  He wears t:slim tandem with Dexcom showing a summary of 65% time in range, 25% level 1 hyperglycemia , 6% level 2 hypoglycemia.  His point-of-care A1c 7.6%, progressively improving from 8.4%.   He did not have significant  hypoglycemia.  He wears t:slim insulin pump with rapid acting insulin settings 1.9 units/h for basal, ICR 1: 10, target glucose of  110, IAT5 hours, ISF: 1:23.   Recent labs reviewed.  His previsit workup confirms type 1 diabetes with elevated levels of ZMT 8 antibodies as well as glutamic acid decarboxylase antibodies.  Patient was diagnosed at approximate age of 18 years when he weighed 220 pounds.  His recent antibody test measurements are consistent with type 1 diabetes.    He did have 1 episode of DKA in 2010 when his blood glucose rose to 1280.    - I had a long discussion with him about the possible risk factors and  the pathology behind its diabetes and its complications. Patient needs further workup to classify his diabetes-see below. -his diabetes is complicated by non proliferative retinopathy, high BMI of 31.65.    he remains at a high risk for more acute and chronic complications which include CAD, CVA, CKD, retinopathy, and neuropathy. These are all discussed in detail with him.  - I discussed all available options of managing his diabetes including de-escalation of medications. I have counseled him on Food as Medicine by adopting a Whole Food , Plant Predominant  ( WFPP) nutrition as recommended by [employer] of Lifestyle Medicine. Patient is encouraged to switch to  unprocessed or minimally processed  complex starch, adequate protein intake (mainly plant source), minimal liquid fat, plenty of fruits, and vegetables. -  he is advised to stick to a routine mealtimes to eat 3 complete meals a day and snack only when necessary ( to snack only to correct hypoglycemia BG <70 day time or <100 at night).   He is still a great candidate for lifestyle medicine. - he acknowledges that there is a room for improvement in his food and drink choices. - Suggestion is made for him to avoid simple carbohydrates  from his diet including Cakes, Sweet Desserts, Ice Cream, Soda (diet and  regular), Sweet Tea, Candies, Chips, Cookies, Store Bought Juices, Alcohol , Artificial Sweeteners,  Coffee Creamer, and "Sugar-free" Products, Lemonade. This will help patient to have more stable blood glucose profile and potentially avoid unintended weight gain.  The following Lifestyle Medicine recommendations according to American College of Lifestyle Medicine  [facility]) were discussed and and offered to patient and he  agrees to start the journey:  A. Whole Foods, Plant-Based Nutrition comprising of fruits and vegetables, plant-based proteins, whole-grain carbohydrates was discussed in detail with the patient.   A list for source of those nutrients were also provided to the patient.  Patient will use only water or unsweetened tea for hydration. B.  The need to stay away from risky substances including alcohol, smoking; obtaining 7 to 9 hours of restorative sleep, at least 150 minutes of moderate intensity exercise weekly, the importance of healthy social connections,  and stress management techniques were discussed. C.  A full color page of  Calorie density of various food groups per pound showing examples of each food groups was provided to the patient.  He made significant changes in his lifestyle and presents with significant improvement in his glycemic profile.  He wears t:slim tandem insulin pump with Dexcom CGM with proper utility.   His settings were reviewed as follows: -Basal insulin following at 1.9 units/h for basal, ICR 1: 10, target glucose of 110, IAT5 hours, ISF: 1:23. - he has been a visit with  Norm Salt, RDN, CDE for individualized diabetes education. -Given his positive workup for type 1 diabetes, he does not have non-insulin treatment options.  He is encouraged to use his Dexcom CGM.  He is advised to call clinic for hypoglycemia under 70 or hyperglycemia above 200 mg per DL.  - Specific targets for  A1c;  LDL, HDL,  and Triglycerides were discussed with the patient.  2)  Blood Pressure /Hypertension: His blood pressure is controlled to target.  he is currently on lisinopril 5 mg p.o. daily.    3) Lipids/Hyperlipidemia:   Review of his recent lipid panel showed controlled  LDL at 72 .  he  is advised to continue Lipitor 80 mg p.o. daily at bedtime.     Side effects and precautions discussed with him.  4)  Weight/Diet:  Body mass index is 31.29 kg/m.  -   clearly complicating his diabetes care.  He wishes to achieve appropriate weight loss.  We discussed on some goals.   he is  a candidate for weight loss. I discussed with him the fact that loss of 5 - 10% of his  current body weight will have the most impact on his diabetes management.  The above detailed  ACLM recommendations for nutrition, exercise, sleep, social life, avoidance of risky substances, the need for restorative sleep   information will also detailed on discharge instructions.  5) Chronic Care/Health Maintenance:  -he  is on ACEI/ARB and Statin medications and  is encouraged to initiate and continue to follow up with Ophthalmology, Dentist,  Podiatrist at least yearly or according to recommendations, and advised to   stay away from smoking. I have recommended yearly flu vaccine and pneumonia vaccine at least every 5 years; moderate intensity exercise for up to 150 minutes weekly; and  sleep for 7- 9 hours a day.  His DM Foot Exam is normal on December 16, 2022. He will also benefit from vitamin D supplement, 2000 units daily.  - he is  advised to maintain close follow up with Park Meo, FNP for primary care needs, as well as his other providers for optimal and coordinated care.  I spent  26  minutes in the care of the patient today including review of labs from CMP, Lipids, Thyroid Function, Hematology (current and previous including abstractions from other facilities); face-to-face time discussing  his blood glucose readings/logs, discussing hypoglycemia and hyperglycemia episodes and symptoms,  medications doses, his options of short and long term treatment based on the latest standards of care / guidelines;  discussion about incorporating lifestyle medicine;  and documenting the encounter. Risk reduction counseling performed per USPSTF guidelines to reduce  obesity and cardiovascular risk factors.     Please refer to Patient Instructions for Blood Glucose Monitoring and Insulin/Medications Dosing Guide"  in media tab for additional information. Please  also refer to " Patient Self Inventory" in the Media  tab for reviewed elements of pertinent patient history.  Thomas Ramsey participated in the discussions, expressed understanding, and voiced agreement with the above plans.  All questions were answered to his satisfaction. he is encouraged to contact clinic should he have any questions or concerns prior to his return visit.   Follow up plan: - Return in about 6 months (around 11/18/2023) for F/U with Pre-visit Labs, Meter/CGM/Logs, A1c here.  Marquis Lunch, MD [facility] [address] [place], [place] [address] Phone: [phone]  Fax: [phone]    05/18/2023, 7:12 PM  This note was partially dictated with voice recognition software. Similar sounding words can be transcribed inadequately or may not  be corrected upon review.

## 2023-05-18 NOTE — Patient Instructions (Signed)

## 2023-05-19 ENCOUNTER — Other Ambulatory Visit: Payer: Self-pay | Admitting: Family Medicine

## 2023-05-19 DIAGNOSIS — E785 Hyperlipidemia, unspecified: Secondary | ICD-10-CM

## 2023-07-08 ENCOUNTER — Encounter: Payer: Self-pay | Admitting: Family Medicine

## 2023-08-20 ENCOUNTER — Other Ambulatory Visit: Payer: Self-pay | Admitting: Family Medicine

## 2023-08-20 DIAGNOSIS — E103293 Type 1 diabetes mellitus with mild nonproliferative diabetic retinopathy without macular edema, bilateral: Secondary | ICD-10-CM

## 2023-08-30 ENCOUNTER — Other Ambulatory Visit: Payer: Self-pay | Admitting: Family Medicine

## 2023-08-30 DIAGNOSIS — E1169 Type 2 diabetes mellitus with other specified complication: Secondary | ICD-10-CM

## 2023-09-02 ENCOUNTER — Ambulatory Visit (INDEPENDENT_AMBULATORY_CARE_PROVIDER_SITE_OTHER): Payer: 59 | Admitting: Family Medicine

## 2023-09-02 VITALS — BP 162/78 | HR 87 | Temp 98.3°F | Ht 73.0 in | Wt 245.0 lb

## 2023-09-02 DIAGNOSIS — E103293 Type 1 diabetes mellitus with mild nonproliferative diabetic retinopathy without macular edema, bilateral: Secondary | ICD-10-CM

## 2023-09-02 DIAGNOSIS — Z0000 Encounter for general adult medical examination without abnormal findings: Secondary | ICD-10-CM | POA: Insufficient documentation

## 2023-09-02 DIAGNOSIS — Z0001 Encounter for general adult medical examination with abnormal findings: Secondary | ICD-10-CM | POA: Diagnosis not present

## 2023-09-02 DIAGNOSIS — E1069 Type 1 diabetes mellitus with other specified complication: Secondary | ICD-10-CM | POA: Diagnosis not present

## 2023-09-02 DIAGNOSIS — Z23 Encounter for immunization: Secondary | ICD-10-CM | POA: Diagnosis not present

## 2023-09-02 NOTE — Assessment & Plan Note (Signed)
Since 2001. Well controlled on current regimen. Continue Humalog 0-100 units daily via pump and Dexcom CGM. A1c and uACR ordered. Foot exam UTD. Vaccines UTD. Retinal eye exam scheduled. Recommend heart healthy diet such as Mediterranean diet with whole grains, fruits, vegetable, fish, lean meats, nuts, and olive oil. Limit salt. Encouraged moderate walking, 3-5 times/week for 30-50 minutes each session. Aim for at least 150 minutes.week. Goal should be pace of 3 miles/hours, or walking 1.5 miles in 30 minutes. Seek medical care for urinary frequency, extreme thirst, vision changes, lightheadedness, dizziness.

## 2023-09-02 NOTE — Progress Notes (Signed)
Complete physical exam  Patient: Thomas Ramsey   DOB: 1981/07/01   42 y.o. Male  MRN: [id number]  Subjective:    Chief Complaint  Patient presents with   Follow-up    Physical     HIKEEM STERMAN is a 42 y.o. male who presents today for a complete physical exam. He reports consuming a general diet. The patient does not participate in regular exercise at present. He generally feels well. He reports sleeping well. He does not have additional problems to discuss today.   DIABETES A1c 7.6% Hypoglycemic episodes:no Polydipsia/polyuria: no Visual disturbance: no Chest pain: no Paresthesias: no Glucose Monitoring: yes  Accucheck frequency:  CGM  Fasting glucose:  Post prandial:  Evening:  Before meals: Taking Insulin?: yes  Long acting insulin:  Short acting insulin: pump Blood Pressure Monitoring: not checking Retinal Examination: Not up to Date Foot Exam: Up to Date Diabetic Education: Completed Pneumovax: Up to Date Influenza: Up to Date Aspirin: no  HYPERLIPIDEMIA Hyperlipidemia status: excellent compliance Satisfied with current treatment?  yes Side effects:  no Medication compliance: excellent compliance Past cholesterol meds: atorvastain (lipitor) Supplements: none Aspirin:  no The 10-year ASCVD risk score (Arnett DK, et al., 2019) is: 3.4%   Values used to calculate the score:     Age: 56 years     Sex: Male     Is Non-Hispanic African American: No     Diabetic: Yes     Tobacco smoker: No     Systolic Blood Pressure: 162 mmHg     Is BP treated: Yes     HDL Cholesterol: 36 mg/dL     Total Cholesterol: 131 mg/dL Chest pain:  no Coronary artery disease:  no Family history CAD:  no Family history early CAD:  no  HYPERTENSION without Chronic Kidney Disease Hypertension status: uncontrolled  Satisfied with current treatment?  unmedicated Duration of hypertension:  n/a BP monitoring frequency:  not checking BP range:  BP medication side effects:   no Medication compliance:  unmedicated Previous BP meds:lisinopril Aspirin: no Recurrent headaches: no Visual changes: no Palpitations: no Dyspnea: no Chest pain: no Lower extremity edema: no Dizzy/lightheaded: no     Most recent fall risk assessment:    02/03/2023    3:26 PM  Fall Risk   Falls in the past year? 0  Number falls in past yr: 0  Injury with Fall? 0     Most recent depression screenings:    09/02/2023    2:43 PM 02/03/2023    3:26 PM  PHQ 2/9 Scores  PHQ - 2 Score 0 0  PHQ- 9 Score 0     Vision:Not within last year  and Dental: No current dental problems  Patient Active Problem List   Diagnosis Date Noted   Physical exam, annual 09/02/2023   Hyperlipidemia due to type 1 diabetes mellitus (HCC) 09/02/2023   Viral URI 02/09/2023   Vitamin D deficiency 12/31/2022   Encounter to establish care 10/28/2022   Hyperlipidemia 01/15/2022   Acute bacterial sinusitis 01/15/2022   Eczema, dyshidrotic 06/16/2019   Obesity 12/14/2018   Hypertensive retinopathy of both eyes 02/15/2018   Nuclear sclerotic cataract of both eyes 02/15/2018   Erectile dysfunction 12/21/2017   Insulin long-term use (HCC) 10/29/2014   Type 1 diabetes mellitus with mild nonproliferative retinopathy (HCC) 05/29/2014   Hypertension associated with diabetes (HCC) 05/29/2014   Hypertension due to endocrine disorder 05/22/2014   Mild nonproliferative diabetic retinopathy (HCC) 01/25/2014   History of  diabetes with ketoacidosis 01/05/2014   Chondromalacia of both patellae 10/18/2013   IBS (irritable bowel syndrome) 10/04/2013   Diabetes mellitus type 1 (HCC) 08/29/2013   Insulin pump status 08/29/2013   Essential (primary) hypertension 08/29/2013   Type 1 diabetes mellitus with hyperlipidemia (HCC) 08/29/2013   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 in thumbs or great toes   Type I (juvenile type) diabetes mellitus  without mention of complication, not stated as uncontrolled    Past Surgical History:  Procedure Laterality Date   FINGER SURGERY  1984   Reattached Index finger (right hand)    TONSILLECTOMY  1992   TYMPANOSTOMY TUBE PLACEMENT  1984   Social History   Tobacco Use   Smoking status: Never   Smokeless tobacco: Never  Vaping Use   Vaping status: Never Used  Substance Use Topics   Alcohol use: No   Drug use: No   Family History  Problem Relation Age of Onset   Thyroid disease Mother    Hyperlipidemia Mother    Cancer Father    Hyperlipidemia Father    Hypertension Father    Diabetes Neg Hx    No Known Allergies    Patient Care Team: Park Meo, FNP as PCP - General (Family Medicine)   Outpatient Medications Prior to Visit  Medication Sig   atorvastatin (LIPITOR) 80 MG tablet Take 1 tablet by mouth once daily   Cholecalciferol (VITAMIN D) 50 MCG (2000 UT) CAPS Take 1 capsule (2,000 Units total) by mouth daily with breakfast.   Continuous Blood Gluc Receiver (DEXCOM G6 RECEIVER) DEVI USE AS DIRECTED FOR CONTINUOUS GLUCOSE MONITORING   Continuous Blood Gluc Sensor (DEXCOM G6 SENSOR) MISC SMARTSIG:1 Topical Every 10 Days   Continuous Glucose Transmitter (DEXCOM G6 TRANSMITTER) MISC USE AS DIRECTED FOR CONTINUOUS GLUCOSE MONITORING. REUSE TRAINSMITTER FOR 90 DAYS THEN DISCARD AND REPALCE   HUMALOG 100 UNIT/ML injection SMARTSIG:0-100 Unit(s) SUB-Q Daily   HUMALOG 100 UNIT/ML injection INJECT 0-100 UNITS SUBCUTANEOUSLY DAILY AS DIRECTED   lisinopril (ZESTRIL) 5 MG tablet Take 1 tablet by mouth once daily   Tadalafil 2.5 MG TABS TAKE 1 TABLET BY MOUTH ONCE DAILY AS NEEDED FOR ERECTILE DYSFUNCTION   No facility-administered medications prior to visit.    Review of Systems  Constitutional: Negative.   HENT: Negative.    Eyes: Negative.   Respiratory: Negative.    Cardiovascular: Negative.   Gastrointestinal: Negative.   Genitourinary: Negative.   Musculoskeletal:  Negative.   Skin: Negative.   Neurological: Negative.   Endo/Heme/Allergies: Negative.   Psychiatric/Behavioral: Negative.    All other systems reviewed and are negative.         Objective:     BP (!) 162/78   Pulse 87   Temp 98.3 F (36.8 C) (Oral)   Ht 6\' 1"  (1.854 m)   Wt 245 lb (111.1 kg)   SpO2 96%   BMI 32.32 kg/m  BP Readings from Last 3 Encounters:  09/02/23 (!) 162/78  05/18/23 (!) 126/54  02/09/23 130/80   Wt Readings from Last 3 Encounters:  09/02/23 245 lb (111.1 kg)  05/18/23 237 lb 3.2 oz (107.6 kg)  02/09/23 237 lb (107.5 kg)      Physical Exam Vitals and nursing note reviewed.  Constitutional:      Appearance: Normal appearance. He is obese.  HENT:     Head: Normocephalic and atraumatic.  Right Ear: Tympanic membrane, ear canal and external ear normal.     Left Ear: Tympanic membrane, ear canal and external ear normal.     Nose: Nose normal.     Mouth/Throat:     Mouth: Mucous membranes are moist.     Pharynx: Oropharynx is clear.  Eyes:     Extraocular Movements: Extraocular movements intact.     Right eye: Normal extraocular motion and no nystagmus.     Left eye: Normal extraocular motion and no nystagmus.     Conjunctiva/sclera: Conjunctivae normal.     Pupils: Pupils are equal, round, and reactive to light.  Cardiovascular:     Rate and Rhythm: Normal rate and regular rhythm.     Pulses: Normal pulses.     Heart sounds: Normal heart sounds.  Pulmonary:     Effort: Pulmonary effort is normal.     Breath sounds: Normal breath sounds.  Abdominal:     General: Bowel sounds are normal.     Palpations: Abdomen is soft.  Genitourinary:    Comments: Deferred using shared decision making Musculoskeletal:        General: Normal range of motion.     Cervical back: Normal range of motion and neck supple.  Skin:    General: Skin is warm and dry.     Capillary Refill: Capillary refill takes less than 2 seconds.  Neurological:      General: No focal deficit present.     Mental Status: He is alert. Mental status is at baseline.  Psychiatric:        Mood and Affect: Mood normal.        Speech: Speech normal.        Behavior: Behavior normal.        Thought Content: Thought content normal.        Cognition and Memory: Cognition and memory normal.        Judgment: Judgment normal.      No results found for any visits on 09/02/23. Last CBC Lab Results  Component Value Date   WBC 6.3 11/26/2022   HGB 15.3 11/26/2022   HCT 45.9 11/26/2022   MCV 89.3 11/26/2022   MCH 29.8 11/26/2022   RDW 12.3 11/26/2022   PLT 296 11/26/2022   Last metabolic panel Lab Results  Component Value Date   GLUCOSE 272 (H) 11/26/2022   NA 138 11/26/2022   K 4.7 11/26/2022   CL 102 11/26/2022   CO2 27 11/26/2022   BUN 16 11/26/2022   CREATININE 1.00 11/26/2022   EGFR 97 11/26/2022   CALCIUM 9.2 11/26/2022   PROT 6.5 11/26/2022   BILITOT 0.6 11/26/2022   AST 23 11/26/2022   ALT 35 11/26/2022   Last lipids Lab Results  Component Value Date   CHOL 131 11/26/2022   HDL 36 (L) 11/26/2022   LDLCALC 72 11/26/2022   TRIG 148 11/26/2022   CHOLHDL 3.6 11/26/2022   Last hemoglobin A1c Lab Results  Component Value Date   HGBA1C 7.6 (A) 05/18/2023   Last thyroid functions Lab Results  Component Value Date   TSH 1.200 12/17/2022   Last vitamin D Lab Results  Component Value Date   VD25OH 23.2 (L) 12/17/2022   Last vitamin B12 and Folate Lab Results  Component Value Date   VITAMINB12 507 12/17/2022        Assessment & Plan:    Routine Health Maintenance and Physical Exam  Immunization History  Administered Date(s) Administered   Hep  B, Unspecified 06/20/2019   Hepatitis B 06/20/2019   Influenza Split 08/01/2013   Influenza,inj,Quad PF,6+ Mos 06/16/2019   Influenza-Unspecified 08/01/2013, 01/22/2021   PFIZER(Purple Top)SARS-COV-2 Vaccination 11/11/2019, 12/05/2019   PPD Test 01/12/2022   Pneumococcal  Polysaccharide-23 04/17/2014   Tdap 03/23/2016    Health Maintenance  Topic Date Due   OPHTHALMOLOGY EXAM  01/28/2022   INFLUENZA VACCINE  04/22/2023   HEMOGLOBIN A1C  11/18/2023   Diabetic kidney evaluation - eGFR measurement  11/26/2023   Diabetic kidney evaluation - Urine ACR  11/26/2023   FOOT EXAM  12/16/2023   DTaP/Tdap/Td (2 - Td or Tdap) 03/23/2026   Hepatitis C Screening  Completed   HIV Screening  Completed   HPV VACCINES  Aged Out   COVID-19 Vaccine  Discontinued    Discussed health benefits of physical activity, and encouraged him to engage in regular exercise appropriate for his age and condition.  Problem List Items Addressed This Visit     Diabetes mellitus type 1 (HCC)   Since 2001. Well controlled on current regimen. Continue Humalog 0-100 units daily via pump and Dexcom CGM. A1c and uACR ordered. Foot exam UTD. Vaccines UTD. Retinal eye exam scheduled. Recommend heart healthy diet such as Mediterranean diet with whole grains, fruits, vegetable, fish, lean meats, nuts, and olive oil. Limit salt. Encouraged moderate walking, 3-5 times/week for 30-50 minutes each session. Aim for at least 150 minutes.week. Goal should be pace of 3 miles/hours, or walking 1.5 miles in 30 minutes. Seek medical care for urinary frequency, extreme thirst, vision changes, lightheadedness, dizziness.      Relevant Orders   CBC with Differential/Platelet   COMPLETE METABOLIC PANEL WITH GFR   Lipid panel   Microalbumin / creatinine urine ratio   Physical exam, annual - Primary   Today your medical history was reviewed and routine physical exam with labs was performed. Recommend 150 minutes of moderate intensity exercise weekly and consuming a well-balanced diet. Advised to stop smoking if a smoker, avoid smoking if a non-smoker, limit alcohol consumption to 1 drink per day for women and 2 drinks per day for men, and avoid illicit drug use. Counseled on safe sex practices and offered STI  testing today. Counseled on the importance of sunscreen use. Counseled in mental health awareness and when to seek medical care. Vaccine maintenance discussed. Appropriate health maintenance items reviewed. Return to office in 1 year for annual physical exam.       Relevant Orders   CBC with Differential/Platelet   COMPLETE METABOLIC PANEL WITH GFR   Lipid panel   Microalbumin / creatinine urine ratio   Hyperlipidemia due to type 1 diabetes mellitus (HCC)   Fasting labs ordered. Continue Atorvastatin 80mg  daily. Your labs showed elevated cholesterol. I recommend consuming a heart healthy diet such as Mediterranean diet or DASH diet with whole grains, fruits, vegetable, fish, lean meats, nuts, and olive oil. Limit sweets and processed foods. I also encourage moderate intensity exercise 150 minutes weekly. This is 3-5 times weekly for 30-50 minutes each session. Goal should be pace of 3 miles/hours, or walking 1.5 miles in 30 minutes. The 10-year ASCVD risk score (Arnett DK, et al., 2019) is: 3.4%       Relevant Orders   CBC with Differential/Platelet   COMPLETE METABOLIC PANEL WITH GFR   Lipid panel   Microalbumin / creatinine urine ratio   Return in about 6 months (around 03/02/2024) for chronic follow-up with labs 1 week prior.     Hospital doctor  Jeannette How, FNP

## 2023-09-02 NOTE — Assessment & Plan Note (Signed)

## 2023-09-02 NOTE — Assessment & Plan Note (Signed)
Fasting labs ordered. Continue Atorvastatin 80mg  daily. Your labs showed elevated cholesterol. I recommend consuming a heart healthy diet such as Mediterranean diet or DASH diet with whole grains, fruits, vegetable, fish, lean meats, nuts, and olive oil. Limit sweets and processed foods. I also encourage moderate intensity exercise 150 minutes weekly. This is 3-5 times weekly for 30-50 minutes each session. Goal should be pace of 3 miles/hours, or walking 1.5 miles in 30 minutes. The 10-year ASCVD risk score (Arnett DK, et al., 2019) is: 3.4%

## 2023-09-03 NOTE — Addendum Note (Signed)
Addended by: Arta Silence on: 09/03/2023 12:14 PM   Modules accepted: Orders

## 2023-09-10 ENCOUNTER — Other Ambulatory Visit: Payer: 59

## 2023-09-10 ENCOUNTER — Ambulatory Visit (INDEPENDENT_AMBULATORY_CARE_PROVIDER_SITE_OTHER): Payer: 59

## 2023-09-10 DIAGNOSIS — E103293 Type 1 diabetes mellitus with mild nonproliferative diabetic retinopathy without macular edema, bilateral: Secondary | ICD-10-CM

## 2023-09-10 DIAGNOSIS — H35033 Hypertensive retinopathy, bilateral: Secondary | ICD-10-CM

## 2023-09-10 DIAGNOSIS — E119 Type 2 diabetes mellitus without complications: Secondary | ICD-10-CM | POA: Diagnosis not present

## 2023-09-10 DIAGNOSIS — Z794 Long term (current) use of insulin: Secondary | ICD-10-CM | POA: Diagnosis not present

## 2023-09-10 DIAGNOSIS — Z7984 Long term (current) use of oral hypoglycemic drugs: Secondary | ICD-10-CM | POA: Diagnosis not present

## 2023-09-10 LAB — HM DIABETES EYE EXAM

## 2023-09-10 NOTE — Progress Notes (Signed)
Thomas Ramsey arrived 09/10/2023 and has given verbal consent to obtain images and complete their overdue diabetic retinal screening.  The images have been sent to an ophthalmologist or optometrist for review and interpretation.  Results will be sent back to Park Meo, FNP for review.  Patient has been informed they will be contacted when we receive the results via telephone or MyChart

## 2023-09-14 ENCOUNTER — Encounter: Payer: PRIVATE HEALTH INSURANCE | Admitting: Family Medicine

## 2023-09-14 LAB — CBC WITH DIFFERENTIAL/PLATELET
Absolute Lymphocytes: 1961 {cells}/uL (ref 850–3900)
Absolute Monocytes: 540 {cells}/uL (ref 200–950)
Basophils Absolute: 38 {cells}/uL (ref 0–200)
Basophils Relative: 0.5 %
Eosinophils Absolute: 167 {cells}/uL (ref 15–500)
Eosinophils Relative: 2.2 %
HCT: 44.7 % (ref 38.5–50.0)
Hemoglobin: 15.1 g/dL (ref 13.2–17.1)
MCH: 29.5 pg (ref 27.0–33.0)
MCHC: 33.8 g/dL (ref 32.0–36.0)
MCV: 87.5 fL (ref 80.0–100.0)
MPV: 9.9 fL (ref 7.5–12.5)
Monocytes Relative: 7.1 %
Neutro Abs: 4894 {cells}/uL (ref 1500–7800)
Neutrophils Relative %: 64.4 %
Platelets: 311 10*3/uL (ref 140–400)
RBC: 5.11 10*6/uL (ref 4.20–5.80)
RDW: 12.1 % (ref 11.0–15.0)
Total Lymphocyte: 25.8 %
WBC: 7.6 10*3/uL (ref 3.8–10.8)

## 2023-09-14 LAB — COMPLETE METABOLIC PANEL WITH GFR
AG Ratio: 2.2 (calc) (ref 1.0–2.5)
ALT: 34 U/L (ref 9–46)
AST: 19 U/L (ref 10–40)
Albumin: 4.2 g/dL (ref 3.6–5.1)
Alkaline phosphatase (APISO): 103 U/L (ref 36–130)
BUN: 12 mg/dL (ref 7–25)
CO2: 28 mmol/L (ref 20–32)
Calcium: 8.7 mg/dL (ref 8.6–10.3)
Chloride: 102 mmol/L (ref 98–110)
Creat: 0.91 mg/dL (ref 0.60–1.29)
Globulin: 1.9 g/dL (ref 1.9–3.7)
Glucose, Bld: 173 mg/dL — ABNORMAL HIGH (ref 65–99)
Potassium: 4.4 mmol/L (ref 3.5–5.3)
Sodium: 137 mmol/L (ref 135–146)
Total Bilirubin: 0.7 mg/dL (ref 0.2–1.2)
Total Protein: 6.1 g/dL (ref 6.1–8.1)
eGFR: 108 mL/min/{1.73_m2} (ref >=60)

## 2023-09-14 LAB — LIPID PANEL
Cholesterol: 118 mg/dL (ref <200)
HDL: 33 mg/dL — ABNORMAL LOW (ref >=40)
LDL Cholesterol (Calc): 69 mg/dL
Non-HDL Cholesterol (Calc): 85 mg/dL (ref <130)
Total CHOL/HDL Ratio: 3.6 (calc) (ref <5.0)
Triglycerides: 81 mg/dL (ref <150)

## 2023-09-14 LAB — MICROALBUMIN / CREATININE URINE RATIO

## 2023-09-16 ENCOUNTER — Encounter: Payer: PRIVATE HEALTH INSURANCE | Admitting: Family Medicine

## 2023-09-28 ENCOUNTER — Other Ambulatory Visit: Payer: Self-pay | Admitting: "Endocrinology

## 2023-09-28 ENCOUNTER — Other Ambulatory Visit: Payer: Self-pay | Admitting: Family Medicine

## 2023-09-28 ENCOUNTER — Encounter: Payer: Self-pay | Admitting: "Endocrinology

## 2023-09-28 DIAGNOSIS — E103293 Type 1 diabetes mellitus with mild nonproliferative diabetic retinopathy without macular edema, bilateral: Secondary | ICD-10-CM

## 2023-09-30 ENCOUNTER — Other Ambulatory Visit: Payer: Self-pay

## 2023-09-30 ENCOUNTER — Encounter: Payer: Self-pay | Admitting: "Endocrinology

## 2023-09-30 DIAGNOSIS — E103293 Type 1 diabetes mellitus with mild nonproliferative diabetic retinopathy without macular edema, bilateral: Secondary | ICD-10-CM

## 2023-09-30 MED ORDER — INSULIN ASPART 100 UNIT/ML IJ SOLN: INTRAMUSCULAR | 1 refills | Status: DC

## 2023-10-04 ENCOUNTER — Ambulatory Visit: Payer: Self-pay | Admitting: Family Medicine

## 2023-10-04 NOTE — Telephone Encounter (Signed)
 Copied from CRM [id number]. Topic: Clinical - Red Word Triage >> Oct 04, 2023 12:12 PM Fredrica W wrote: Red Word that prompted transfer to Nurse Triage: Wheezing with sinus pressure and body aches   Chief Complaint: Sinus Pain and Pressure Symptoms: Sinus Pain, Congestion, Wheezing on Inspiration Frequency: Last Thursday Pertinent Negatives: Patient denies chest pain, difficulty breathing.  Disposition: [] ED /[] Urgent Care (no appt availability in office) / [x] Appointment(In office/virtual)/ []  [facility]/ [] Home Care/ [] Refused Recommended Disposition /[]  Mobile Bus/ []  Follow-up with PCP Additional Notes: Thomas Ramsey is a 43 year old male complaining of sinus pressure, pain, and congestion since Thursday of last week. The patient denies acute distress like symptoms. In office appointment made. No other requests or complaints voiced.    Reason for Disposition  [1] Sinus congestion (pressure, fullness) AND [2] present > 10 days  Answer Assessment - Initial Assessment Questions 1. LOCATION: Where does it hurt?      Sinus Pressure and Pain, Forehead, Under Eyes, and Ears   2. ONSET: When did the sinus pain start?  (e.g., hours, days)      Last Thursday  3. SEVERITY: How bad is the pain?   (Scale 1-10; mild, moderate or severe)   - MILD (1-3): doesn't interfere with normal activities    - MODERATE (4-7): interferes with normal activities (e.g., work or school) or awakens from sleep   - SEVERE (8-10): excruciating pain and patient unable to do any normal activities        3  4. RECURRENT SYMPTOM: Have you ever had sinus problems before? If Yes, ask: When was the last time? and What happened that time?      Yes, Yearly occurence  5. NASAL CONGESTION: Is the nose blocked? If Yes, ask: Can you open it or must you breathe through your mouth?     Yes, Blocked  6. NASAL DISCHARGE: Do you have discharge from your nose? If so ask, What color?     Thomas Ramsey, Brown  7. FEVER: Do you have a fever? If Yes, ask: What is it, how was it measured, and when did it start?      98.0, No fever.   8. OTHER SYMPTOMS: Do you have any other symptoms? (e.g., sore throat, cough, earache, difficulty breathing)     Diaphoresis with Exertion, Wheezing with Deep Inspiration  Protocols used: Sinus Pain or Congestion-A-AH

## 2023-10-05 ENCOUNTER — Encounter: Payer: Self-pay | Admitting: Family Medicine

## 2023-10-05 ENCOUNTER — Ambulatory Visit (INDEPENDENT_AMBULATORY_CARE_PROVIDER_SITE_OTHER): Payer: 59 | Admitting: Family Medicine

## 2023-10-05 VITALS — BP 128/72 | HR 86 | Temp 98.3°F | Ht 73.0 in | Wt 240.0 lb

## 2023-10-05 DIAGNOSIS — J019 Acute sinusitis, unspecified: Secondary | ICD-10-CM

## 2023-10-05 DIAGNOSIS — B9689 Other specified bacterial agents as the cause of diseases classified elsewhere: Secondary | ICD-10-CM

## 2023-10-05 MED ORDER — AMOXICILLIN-POT CLAVULANATE 875-125 MG PO TABS: 1.0000 | ORAL_TABLET | Freq: Two times a day (BID) | ORAL | 0 refills | Status: DC

## 2023-10-05 NOTE — Assessment & Plan Note (Signed)
 Symptoms consistent with prolonged bacterial sinus infection. Start Augmentin  875-125mg  BID for 7d. Continue symptomatic management with Tylenol  and Mucinex. May use saline flushes and neti pot for nasal congestion. Return to office if symptoms persist or worsen.

## 2023-10-05 NOTE — Progress Notes (Signed)
 Subjective:  HPI: Thomas Ramsey is a 43 y.o. 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 He also reports foul tasting mucus that is brown. Denies current fever, chills, cough, pleurisy, SOB, wheezing. Has tried Mucinex.  Review of Systems  All other systems reviewed and are negative.   Relevant past medical history reviewed and updated as indicated.   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 in thumbs or great toes   Type I (juvenile type) diabetes mellitus without mention of complication, not stated as uncontrolled      Past Surgical History:  Procedure Laterality Date   FINGER SURGERY  1984   Reattached Index finger (right hand)    TONSILLECTOMY  1992   TYMPANOSTOMY TUBE PLACEMENT  1984    Allergies and medications reviewed and updated.   Current Outpatient Medications:    amoxicillin -clavulanate (AUGMENTIN ) 875-125 MG tablet, Take 1 tablet by mouth 2 (two) times daily., Disp: 20 tablet, Rfl: 0   atorvastatin  (LIPITOR) 80 MG tablet, Take 1 tablet by mouth once daily, Disp: 90 tablet, Rfl: 0   Cholecalciferol (VITAMIN D ) 50 MCG (2000 UT) CAPS, Take 1 capsule (2,000 Units total) by mouth daily with breakfast., Disp: 90 capsule, Rfl: 1   Continuous Blood Gluc Receiver (DEXCOM G6 RECEIVER) DEVI, USE AS DIRECTED FOR CONTINUOUS GLUCOSE MONITORING, Disp: , Rfl:    Continuous Blood Gluc Sensor (DEXCOM G6 SENSOR) MISC, SMARTSIG:1 Topical Every 10 Days, Disp: 9 each, Rfl: 3   Continuous Glucose Transmitter (DEXCOM G6 TRANSMITTER) MISC, USE AS DIRECTED FOR CONTINUOUS GLUCOSE MONITORING. REUSE TRAINSMITTER FOR 90 DAYS THEN DISCARD AND REPALCE, Disp: 1 each, Rfl: 1   insulin  aspart (NOVOLOG ) 100 UNIT/ML injection,  Inject 0-100 units subcutaneously daily as directed, Disp: 50 mL, Rfl: 1   lisinopril  (ZESTRIL ) 5 MG tablet, Take 1 tablet by mouth once daily, Disp: 90 tablet, Rfl: 0   Tadalafil  2.5 MG TABS, TAKE 1 TABLET BY MOUTH ONCE DAILY AS NEEDED FOR ERECTILE DYSFUNCTION, Disp: 30 tablet, Rfl: 0  No Known Allergies  Objective:   BP 128/72   Pulse 86   Temp 98.3 F (36.8 C) (Oral)   Ht 6' 1 (1.854 m)   Wt 240 lb (108.9 kg)   SpO2 98%   BMI 31.66 kg/m      10/05/2023    9:06 AM 09/02/2023    2:38 PM 09/02/2023    2:33 PM  Vitals with BMI  Height 6' 1  6' 1  Weight 240 lbs  245 lbs  BMI 31.67  32.33  Systolic 128 162 839  Diastolic 72 78 78  Pulse 86  87     Physical Exam Vitals and nursing note reviewed.  Constitutional:      Appearance: Normal appearance. He is normal weight.  HENT:     Head: Normocephalic and atraumatic.     Right Ear: Tympanic membrane, ear canal and external ear normal.     Left Ear: Tympanic membrane, ear canal and external ear normal.     Nose: Congestion present.     Right Sinus: Frontal sinus tenderness present.     Left Sinus: Frontal sinus tenderness present.     Mouth/Throat:     Mouth: Mucous membranes  are moist.     Pharynx: Oropharynx is clear.  Eyes:     Extraocular Movements: Extraocular movements intact.     Conjunctiva/sclera: Conjunctivae normal.     Pupils: Pupils are equal, round, and reactive to light.  Cardiovascular:     Rate and Rhythm: Normal rate and regular rhythm.     Pulses: Normal pulses.     Heart sounds: Normal heart sounds.  Pulmonary:     Effort: Pulmonary effort is normal.     Breath sounds: Normal breath sounds.  Musculoskeletal:     Cervical back: No tenderness.  Lymphadenopathy:     Cervical: No cervical adenopathy.  Skin:    General: Skin is warm and dry.     Capillary Refill: Capillary refill takes less than 2 seconds.  Neurological:     General: No focal deficit present.     Mental Status: He is alert  and oriented to person, place, and time. Mental status is at baseline.  Psychiatric:        Mood and Affect: Mood normal.        Behavior: Behavior normal.        Thought Content: Thought content normal.        Judgment: Judgment normal.     Assessment & Plan:  Acute bacterial sinusitis Assessment & Plan: Symptoms consistent with prolonged bacterial sinus infection. Start Augmentin  875-125mg  BID for 7d. Continue symptomatic management with Tylenol  and Mucinex. May use saline flushes and neti pot for nasal congestion. Return to office if symptoms persist or worsen.   Other orders -     Amoxicillin -Pot Clavulanate; Take 1 tablet by mouth 2 (two) times daily.  Dispense: 20 tablet; Refill: 0     Follow up plan: Return if symptoms worsen or fail to improve.  Jeoffrey GORMAN Barrio, FNP

## 2023-11-01 ENCOUNTER — Encounter: Payer: Self-pay | Admitting: Family Medicine

## 2023-11-12 ENCOUNTER — Other Ambulatory Visit: Payer: Self-pay | Admitting: Family Medicine

## 2023-11-12 DIAGNOSIS — E1169 Type 2 diabetes mellitus with other specified complication: Secondary | ICD-10-CM

## 2023-11-12 DIAGNOSIS — E103293 Type 1 diabetes mellitus with mild nonproliferative diabetic retinopathy without macular edema, bilateral: Secondary | ICD-10-CM

## 2023-11-15 ENCOUNTER — Ambulatory Visit: Payer: 59 | Admitting: Family Medicine

## 2023-11-15 ENCOUNTER — Encounter: Payer: Self-pay | Admitting: Family Medicine

## 2023-11-15 VITALS — BP 158/98 | HR 68 | Temp 97.9°F | Ht 73.0 in | Wt 242.0 lb

## 2023-11-15 DIAGNOSIS — I10 Essential (primary) hypertension: Secondary | ICD-10-CM | POA: Diagnosis not present

## 2023-11-15 DIAGNOSIS — Z8739 Personal history of other diseases of the musculoskeletal system and connective tissue: Secondary | ICD-10-CM | POA: Insufficient documentation

## 2023-11-15 NOTE — Assessment & Plan Note (Addendum)
 BP 158/98 today in office, BP 128/72 last month. Will monitor at home and report readings to office. Return to office if BP sustains >130/80. Seek medical care for chest pain, palpitations, shortness of breath with exertion, dizziness/lightheadedness, vision changes, recurrent headaches, or swelling of extremities. Keep scheduled upcoming OV.

## 2023-11-15 NOTE — Progress Notes (Signed)
 Subjective:  HPI: Thomas Ramsey is a 43 y.o. male presenting on 11/15/2023 for No chief complaint on file.   HPI Patient is in today for requests for note stating he needs a load bearing vest as opposed to a duty belt to carry his items for work. He works as a Emergency planning/management officer. He does have a PMH of low back pain with right sided radiculopathy dating back to 2019. He did have x-rays at that time and underwent PT. His pain is exacerbated by the weight of his duty belt. No current symptoms. Of note, his BP is elevated today in office and he reports he is coming straight from working night shift and having had coffee recently. Prior BP was well controlled.   Review of Systems  All other systems reviewed and are negative.   Relevant past medical history reviewed and updated as indicated.   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 in thumbs or great toes   Type I (juvenile type) diabetes mellitus without mention of complication, not stated as uncontrolled      Past Surgical History:  Procedure Laterality Date   FINGER SURGERY  1984   Reattached Index finger (right hand)    TONSILLECTOMY  1992   TYMPANOSTOMY TUBE PLACEMENT  1984    Allergies and medications reviewed and updated.   Current Outpatient Medications:    amoxicillin-clavulanate (AUGMENTIN) 875-125 MG tablet, Take 1 tablet by mouth 2 (two) times daily., Disp: 20 tablet, Rfl: 0   atorvastatin (LIPITOR) 80 MG tablet, Take 1 tablet by mouth once daily, Disp: 90 tablet, Rfl: 0   Cholecalciferol (VITAMIN D) 50 MCG (2000 UT) CAPS, Take 1 capsule (2,000 Units total) by mouth daily with breakfast., Disp: 90 capsule, Rfl: 1   Continuous Blood Gluc Receiver (DEXCOM G6 RECEIVER) DEVI, USE AS DIRECTED FOR CONTINUOUS GLUCOSE MONITORING, Disp: , Rfl:    Continuous Blood Gluc Sensor (DEXCOM G6 SENSOR) MISC, SMARTSIG:1 Topical Every 10 Days, Disp: 9 each, Rfl: 3    Continuous Glucose Transmitter (DEXCOM G6 TRANSMITTER) MISC, USE AS DIRECTED FOR CONTINUOUS GLUCOSE MONITORING. REUSE TRAINSMITTER FOR 90 DAYS THEN DISCARD AND REPALCE, Disp: 1 each, Rfl: 1   insulin aspart (NOVOLOG) 100 UNIT/ML injection, Inject 0-100 units subcutaneously daily as directed, Disp: 50 mL, Rfl: 1   lisinopril (ZESTRIL) 5 MG tablet, Take 1 tablet by mouth once daily, Disp: 90 tablet, Rfl: 0   Tadalafil 2.5 MG TABS, TAKE 1 TABLET BY MOUTH ONCE DAILY AS NEEDED FOR ERECTILE DYSFUNCTION, Disp: 30 tablet, Rfl: 0  No Known Allergies  Objective:   BP (!) 158/98   Pulse 68   Temp 97.9 F (36.6 C) (Oral)   Ht 6\' 1"  (1.854 m)   Wt 242 lb (109.8 kg)   SpO2 96%   BMI 31.93 kg/m      11/15/2023    8:04 AM 10/05/2023    9:06 AM 09/02/2023    2:38 PM  Vitals with BMI  Height 6\' 1"  6\' 1"    Weight 242 lbs 240 lbs   BMI 31.93 31.67   Systolic 158 128 161  Diastolic 98 72 78  Pulse 68 86      Physical Exam Vitals and nursing note reviewed.  Constitutional:      Appearance: Normal appearance. He is normal weight.  HENT:     Head: Normocephalic and atraumatic.  Cardiovascular:     Rate and Rhythm:  Normal rate and regular rhythm.     Pulses: Normal pulses.     Heart sounds: Normal heart sounds.  Pulmonary:     Effort: Pulmonary effort is normal.     Breath sounds: Normal breath sounds.  Skin:    General: Skin is warm and dry.     Capillary Refill: Capillary refill takes less than 2 seconds.  Neurological:     General: No focal deficit present.     Mental Status: He is alert and oriented to person, place, and time. Mental status is at baseline.  Psychiatric:        Mood and Affect: Mood normal.        Behavior: Behavior normal.        Thought Content: Thought content normal.        Judgment: Judgment normal.     Assessment & Plan:  History of low back pain Assessment & Plan: Requires load bearing vest in lieu of a duty belt, work note provided.    Essential  (primary) hypertension Assessment & Plan: BP 158/98 today in office, BP 128/72 last month. Will monitor at home and report readings to office. Return to office if BP sustains >130/80. Seek medical care for chest pain, palpitations, shortness of breath with exertion, dizziness/lightheadedness, vision changes, recurrent headaches, or swelling of extremities. Keep scheduled upcoming OV.       Follow up plan: Return if symptoms worsen or fail to improve.  Park Meo, FNP

## 2023-11-15 NOTE — Assessment & Plan Note (Signed)
 Requires load bearing vest in lieu of a duty belt, work note provided.

## 2023-11-18 ENCOUNTER — Ambulatory Visit: Payer: 59 | Admitting: "Endocrinology

## 2023-12-13 ENCOUNTER — Telehealth: Payer: Self-pay | Admitting: "Endocrinology

## 2023-12-13 NOTE — Telephone Encounter (Signed)
 Labs need updated

## 2023-12-14 ENCOUNTER — Other Ambulatory Visit: Payer: Self-pay | Admitting: *Deleted

## 2023-12-14 DIAGNOSIS — E559 Vitamin D deficiency, unspecified: Secondary | ICD-10-CM

## 2023-12-14 DIAGNOSIS — E103293 Type 1 diabetes mellitus with mild nonproliferative diabetic retinopathy without macular edema, bilateral: Secondary | ICD-10-CM

## 2023-12-14 DIAGNOSIS — E6609 Other obesity due to excess calories: Secondary | ICD-10-CM

## 2023-12-14 DIAGNOSIS — E782 Mixed hyperlipidemia: Secondary | ICD-10-CM

## 2023-12-14 NOTE — Telephone Encounter (Signed)
 Lab have been updayed

## 2023-12-15 LAB — VITAMIN D 25 HYDROXY (VIT D DEFICIENCY, FRACTURES): Vit D, 25-Hydroxy: 35.6 ng/mL (ref 30.0–100.0)

## 2023-12-15 LAB — TSH: TSH: 0.734 u[IU]/mL (ref 0.450–4.500)

## 2023-12-15 LAB — LIPID PANEL
Chol/HDL Ratio: 3.9 ratio (ref 0.0–5.0)
Cholesterol, Total: 149 mg/dL (ref 100–199)
HDL: 38 mg/dL — ABNORMAL LOW (ref >39)
LDL Chol Calc (NIH): 100 mg/dL — ABNORMAL HIGH (ref 0–99)
Triglycerides: 53 mg/dL (ref 0–149)
VLDL Cholesterol Cal: 11 mg/dL (ref 5–40)

## 2023-12-15 LAB — COMPREHENSIVE METABOLIC PANEL
ALT: 42 IU/L (ref 0–44)
AST: 22 IU/L (ref 0–40)
Albumin: 4.6 g/dL (ref 4.1–5.1)
Alkaline Phosphatase: 125 IU/L — ABNORMAL HIGH (ref 44–121)
BUN/Creatinine Ratio: 11 (ref 9–20)
BUN: 12 mg/dL (ref 6–24)
Bilirubin Total: 0.6 mg/dL (ref 0.0–1.2)
CO2: 25 mmol/L (ref 20–29)
Calcium: 9.4 mg/dL (ref 8.7–10.2)
Chloride: 99 mmol/L (ref 96–106)
Creatinine, Ser: 1.05 mg/dL (ref 0.76–1.27)
Globulin, Total: 2.2 g/dL (ref 1.5–4.5)
Glucose: 175 mg/dL — ABNORMAL HIGH (ref 70–99)
Potassium: 5 mmol/L (ref 3.5–5.2)
Sodium: 139 mmol/L (ref 134–144)
Total Protein: 6.8 g/dL (ref 6.0–8.5)
eGFR: 91 mL/min/{1.73_m2} (ref >59)

## 2023-12-15 LAB — VITAMIN B12: Vitamin B-12: 551 pg/mL (ref 232–1245)

## 2023-12-15 LAB — T4, FREE: Free T4: 1.3 ng/dL (ref 0.82–1.77)

## 2023-12-22 ENCOUNTER — Encounter: Payer: Self-pay | Admitting: "Endocrinology

## 2023-12-22 ENCOUNTER — Ambulatory Visit: Payer: 59 | Admitting: "Endocrinology

## 2023-12-22 VITALS — BP 138/78 | HR 64 | Ht 71.0 in | Wt 239.2 lb

## 2023-12-22 DIAGNOSIS — E103293 Type 1 diabetes mellitus with mild nonproliferative diabetic retinopathy without macular edema, bilateral: Secondary | ICD-10-CM

## 2023-12-22 DIAGNOSIS — E66811 Obesity, class 1: Secondary | ICD-10-CM

## 2023-12-22 DIAGNOSIS — E559 Vitamin D deficiency, unspecified: Secondary | ICD-10-CM

## 2023-12-22 DIAGNOSIS — E782 Mixed hyperlipidemia: Secondary | ICD-10-CM | POA: Diagnosis not present

## 2023-12-22 DIAGNOSIS — E6609 Other obesity due to excess calories: Secondary | ICD-10-CM

## 2023-12-22 DIAGNOSIS — Z6831 Body mass index (BMI) 31.0-31.9, adult: Secondary | ICD-10-CM

## 2023-12-22 LAB — POCT GLYCOSYLATED HEMOGLOBIN (HGB A1C): HbA1c, POC (controlled diabetic range): 8 % — AB (ref 0.0–7.0)

## 2023-12-22 NOTE — Progress Notes (Signed)
 Endocrinology Consult Note       12/22/2023, 7:07 PM   Subjective:    Patient ID: Thomas Ramsey, male    DOB: 1981-04-05.  Thomas Ramsey is being seen in consultation for management of currently uncontrolled symptomatic diabetes requested by  Park Meo, FNP.   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 in thumbs or great toes   Type I (juvenile type) diabetes mellitus without mention of complication, not stated as uncontrolled     Past Surgical History:  Procedure Laterality Date   FINGER SURGERY  1984   Reattached Index finger (right hand)    TONSILLECTOMY  1992   TYMPANOSTOMY TUBE PLACEMENT  1984    Social History   Socioeconomic History   Marital status: Single    Spouse name: Not on file   Number of children: Not on file   Years of education: Not on file   Highest education level: 12th grade  Occupational History    Employer: [employer]  Tobacco Use   Smoking status: Never   Smokeless tobacco: Never  Vaping Use   Vaping status: Never Used  Substance and Sexual Activity   Alcohol use: No   Drug use: No   Sexual activity: Yes  Other Topics Concern   Not on file  Social History Narrative   Not on file   Social Drivers of Health   Financial Resource Strain: Low Risk  (09/02/2023)   Overall Financial Resource Strain (CARDIA)    Difficulty of Paying Living Expenses: Not hard at all  Food Insecurity: No Food Insecurity (09/02/2023)   Hunger Vital Sign    Worried About Running Out of Food in the Last Year: Never true    Ran Out of Food in the Last Year: Never true  Transportation Needs: No Transportation Needs (09/02/2023)   PRAPARE - Administrator, Civil Service (Medical): No    Lack of Transportation (Non-Medical): No  Physical Activity: Insufficiently Active (09/02/2023)    Exercise Vital Sign    Days of Exercise per Week: 2 days    Minutes of Exercise per Session: 30 min  Stress: No Stress Concern Present (09/02/2023)   [employer] of Occupational Health - Occupational Stress Questionnaire    Feeling of Stress : Not at all  Social Connections: Moderately Isolated (09/02/2023)   Social Connection and Isolation Panel [NHANES]    Frequency of Communication with Friends and Family: More than three times a week    Frequency of Social Gatherings with Friends and Family: Twice a week    Attends Religious Services: More than 4 times per year    Active Member of [employer] or Organizations: No    Attends Engineer, structural: Not on file    Marital Status: Never married    Family History  Problem Relation Age of Onset   Thyroid disease Mother    Hyperlipidemia Mother    Cancer Father    Hyperlipidemia Father    Hypertension Father    Diabetes Neg Hx  Outpatient Encounter Medications as of 12/22/2023  Medication Sig   atorvastatin (LIPITOR) 80 MG tablet Take 1 tablet by mouth once daily   Cholecalciferol (VITAMIN D) 50 MCG (2000 UT) CAPS Take 1 capsule (2,000 Units total) by mouth daily with breakfast.   Continuous Blood Gluc Receiver (DEXCOM G6 RECEIVER) DEVI USE AS DIRECTED FOR CONTINUOUS GLUCOSE MONITORING   Continuous Glucose Sensor (DEXCOM G6 SENSOR) MISC USE TO CHECK GLUCOSE ONCE DAILY. CHANGE EVERY 10 DAYS   Continuous Glucose Transmitter (DEXCOM G6 TRANSMITTER) MISC USE AS DIRECTED FOR CONTINUOUS GLUCOSE MONITORING. REUSE TRAINSMITTER FOR 90 DAYS THEN DISCARD AND REPALCE   insulin aspart (NOVOLOG) 100 UNIT/ML injection Inject 0-100 units subcutaneously daily as directed   lisinopril (ZESTRIL) 5 MG tablet Take 1 tablet by mouth once daily   Tadalafil 2.5 MG TABS TAKE 1 TABLET BY MOUTH ONCE DAILY AS NEEDED FOR ERECTILE DYSFUNCTION   [DISCONTINUED] amoxicillin-clavulanate (AUGMENTIN) 875-125 MG tablet Take 1 tablet by mouth 2 (two) times  daily.   No facility-administered encounter medications on file as of 12/22/2023.    ALLERGIES: No Known Allergies  VACCINATION STATUS: Immunization History  Administered Date(s) Administered   Hep B, Unspecified 06/20/2019   Hepatitis B 06/20/2019   Influenza Split 08/01/2013   Influenza, Seasonal, Injecte, Preservative Fre 09/02/2023   Influenza,inj,Quad PF,6+ Mos 06/16/2019   Influenza-Unspecified 08/01/2013, 01/22/2021   PFIZER(Purple Top)SARS-COV-2 Vaccination 11/11/2019, 12/05/2019   PPD Test 01/12/2022   Pneumococcal Polysaccharide-23 04/17/2014   Tdap 03/23/2016    Diabetes He presents for his follow-up diabetic visit. He has type 1 (?  Patient was diagnosed at approximate age of 18 years when he weighed 220 pounds.  He did have 1 epepisode of DKA when his blood glucose rose to 1280.  Patient does not recall if he ever had antibodies tested to classify his diabetes.) diabetes mellitus. His disease course has been worsening. There are no hypoglycemic associated symptoms. Pertinent negatives for hypoglycemia include no confusion, headaches, pallor or seizures. Pertinent negatives for diabetes include no chest pain, no fatigue, no polydipsia, no polyphagia, no polyuria and no weakness. There are no hypoglycemic complications. Symptoms are worsening. Diabetic complications include impotence and retinopathy. Risk factors for coronary artery disease include diabetes mellitus, dyslipidemia, male sex, obesity and family history. Current diabetic treatment includes insulin pump. His weight is stable (He presents with 6 pounds of weight loss since last visit.). He is following a generally unhealthy diet. When asked about meal planning, he reported none. He has not had a previous visit with a dietitian. He participates in exercise intermittently. His home blood glucose trend is fluctuating minimally. His breakfast blood glucose range is generally 140-180 mg/dl. His lunch blood glucose range is  generally 140-180 mg/dl. His dinner blood glucose range is generally 140-180 mg/dl. His bedtime blood glucose range is generally 140-180 mg/dl. His overall blood glucose range is 140-180 mg/dl. (He presents with slightly above target glycemic profile.  He is CGM sensor results show 60% time range, 30% level 1 hyperglycemia, 8% level 2 hyperglycemia.  His average blood glucose is 173 mg per DL.  His point-of-care A1c is 8% increasing from 7.6% during his last visit.     He did not have significant hypoglycemia.  He wears t:slim insulin pump with rapid acting insulin settings  1.8-1.9 units/h for basal, ICR 1: 10, target glucose of 110, IAT5 hours, ISF: 1:23. ) An ACE inhibitor/angiotensin II receptor blocker is being taken.  Hyperlipidemia This is a chronic problem. The current episode started more  than 1 year ago. The problem is controlled. Pertinent negatives include no chest pain, myalgias or shortness of breath. Current antihyperlipidemic treatment includes statins. Risk factors for coronary artery disease include diabetes mellitus, dyslipidemia, male sex and family history.    Review of Systems  Constitutional:  Negative for chills, fatigue, fever and unexpected weight change.  HENT:  Negative for dental problem, mouth sores and trouble swallowing.   Eyes:  Negative for visual disturbance.  Respiratory:  Negative for cough, choking, chest tightness, shortness of breath and wheezing.   Cardiovascular:  Negative for chest pain, palpitations and leg swelling.  Gastrointestinal:  Negative for abdominal distention, abdominal pain, constipation, diarrhea, nausea and vomiting.  Endocrine: Negative for polydipsia, polyphagia and polyuria.  Genitourinary:  Positive for impotence. Negative for dysuria, flank pain, hematuria and urgency.  Musculoskeletal:  Negative for back pain, gait problem, myalgias and neck pain.  Skin:  Negative for pallor, rash and wound.  Neurological:  Negative for seizures,  syncope, weakness, numbness and headaches.  Psychiatric/Behavioral: Negative.  Negative for confusion and dysphoric mood.     Objective:       12/22/2023    3:59 PM 11/15/2023    8:04 AM 10/05/2023    9:06 AM  Vitals with BMI  Height 5\' 11"  6\' 1"  6\' 1"   Weight 239 lbs 3 oz 242 lbs 240 lbs  BMI 33.38 31.93 31.67  Systolic 138 158 657  Diastolic 78 98 72  Pulse 64 68 86    BP 138/78   Pulse 64   Ht 5\' 11"  (1.803 m)   Wt 239 lb 3.2 oz (108.5 kg)   BMI 33.36 kg/m   Wt Readings from Last 3 Encounters:  12/22/23 239 lb 3.2 oz (108.5 kg)  11/15/23 242 lb (109.8 kg)  10/05/23 240 lb (108.9 kg)      CMP     Component Value Date/Time   NA 139 12/14/2023 1155   K 5.0 12/14/2023 1155   CL 99 12/14/2023 1155   CO2 25 12/14/2023 1155   GLUCOSE 175 (H) 12/14/2023 1155   GLUCOSE 173 (H) 09/10/2023 1017   BUN 12 12/14/2023 1155   CREATININE 1.05 12/14/2023 1155   CREATININE 0.91 09/10/2023 1017   CALCIUM 9.4 12/14/2023 1155   PROT 6.8 12/14/2023 1155   ALBUMIN 4.6 12/14/2023 1155   AST 22 12/14/2023 1155   ALT 42 12/14/2023 1155   ALKPHOS 125 (H) 12/14/2023 1155   BILITOT 0.6 12/14/2023 1155   GFRNONAA 103 06/09/2018 1004   GFRAA 120 06/09/2018 1004    Diabetic Labs (most recent): Lab Results  Component Value Date   HGBA1C 8.0 (A) 12/22/2023   HGBA1C 7.6 (A) 05/18/2023   HGBA1C 8.4 (H) 11/26/2022     Lipid Panel ( most recent) Lipid Panel     Component Value Date/Time   CHOL 149 12/14/2023 1155   TRIG 53 12/14/2023 1155   HDL 38 (L) 12/14/2023 1155   CHOLHDL 3.9 12/14/2023 1155   CHOLHDL 3.6 09/10/2023 1017   LDLCALC 100 (H) 12/14/2023 1155   LDLCALC 69 09/10/2023 1017   LABVLDL 11 12/14/2023 1155     Assessment & Plan:   1. 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  He presents with slightly  above target glycemic profile.  He is CGM sensor results show 60% time range,  30% level 1 hyperglycemia, 8% level 2 hyperglycemia.  His average blood glucose is 173 mg per DL.  His point-of-care A1c is 8% increasing from 7.6% during his last visit.     He did not have significant hypoglycemia.  He wears t:slim insulin pump with rapid acting insulin settings  1.8-1.9 units/h for basal, ICR 1: 10, target glucose of 110, IAT5 hours, ISF: 1:23.   Recent labs reviewed.  His previsit workup confirms type 1 diabetes with elevated levels of ZMT 8 antibodies as well as glutamic acid decarboxylase antibodies.  Patient was diagnosed at approximate age of 18 years when he weighed 220 pounds.  His recent antibody test measurements are consistent with type 1 diabetes.    He did have 1 episode of DKA in 2010 when his blood glucose rose to 1280.    - I had a long discussion with him about the possible risk factors and  the pathology behind its diabetes and its complications. Patient needs further workup to classify his diabetes-see below. -his diabetes is complicated by non proliferative retinopathy, high BMI of 31.65.    he remains at a high risk for more acute and chronic complications which include CAD, CVA, CKD, retinopathy, and neuropathy. These are all discussed in detail with him.  - I discussed all available options of managing his diabetes including de-escalation of medications. I have counseled him on Food as Medicine by adopting a Whole Food , Plant Predominant  ( WFPP) nutrition as recommended by [employer] of Lifestyle Medicine. Patient is encouraged to switch to  unprocessed or minimally processed  complex starch, adequate protein intake (mainly plant source), minimal liquid fat, plenty of fruits, and vegetables. -  he is advised to stick to a routine mealtimes to eat 3 complete meals a day and snack only when necessary ( to snack only to correct hypoglycemia BG <70 day time or <100 at night).    He is still a great candidate for lifestyle medicine, in light of the fact that he wishes to achieve weight loss.  - he acknowledges that there is a room for improvement in his food and drink choices. - Suggestion is made for him to avoid simple carbohydrates  from his diet including Cakes, Sweet Desserts, Ice Cream, Soda (diet and regular), Sweet Tea, Candies, Chips, Cookies, Store Bought Juices, Alcohol , Artificial Sweeteners,  Coffee Creamer, and "Sugar-free" Products, Lemonade. This will help patient to have more stable blood glucose profile and potentially avoid unintended weight gain.  The following Lifestyle Medicine recommendations according to American College of Lifestyle Medicine  [facility]) were discussed and and offered to patient and he  agrees to start the journey:  A. Whole Foods, Plant-Based Nutrition comprising of fruits and vegetables, plant-based proteins, whole-grain carbohydrates was discussed in detail with the patient.   A list for source of those nutrients were also provided to the patient.  Patient will use only water or unsweetened tea for hydration. B.  The need to stay away from risky substances including alcohol, smoking; obtaining 7 to 9 hours of restorative sleep, at least 150 minutes of moderate intensity exercise weekly, the importance of healthy social connections,  and stress management techniques were discussed. C.  A full color page of  Calorie density of various food groups per pound showing examples of each food groups was provided to the patient.   He wears t:slim tandem insulin pump with Dexcom CGM with proper utility of NovoLog.   His settings were reviewed  as follows: -To give him slightly better control of glycemia, I revised his basal insulin to 2 units/h for a total of 48 units of basal insulin daily, continue ICR 1: 10, target glucose of 110, IAT5 hours, ISF: 1:23. - he has been a visit with  Norm Salt, RDN, CDE for individualized diabetes  education. -Given his positive workup for type 1 diabetes, he does not have non-insulin treatment options.  He is encouraged to use his Dexcom CGM.  He is advised to call clinic for hypoglycemia under 70 or hyperglycemia above 200 mg per DL.  - Specific targets for  A1c;  LDL, HDL,  and Triglycerides were discussed with the patient.  2) Blood Pressure /Hypertension: His blood pressure is controlled to target.  he is currently on lisinopril 5 mg p.o. daily.    3) Lipids/Hyperlipidemia:   Review of his recent lipid panel showed controlled  LDL at 72 .  He is advised to continue Lipitor 80 mg p.o. daily at bedtime.    Side effects and precautions discussed with him.  4)  Weight/Diet:  Body mass index is 33.36 kg/m.  -   clearly complicating his diabetes care.  He wishes to achieve appropriate weight loss.  We discussed on some goals.   he is  a candidate for weight loss. I discussed with him the fact that loss of 5 - 10% of his  current body weight will have the most impact on his diabetes management.  The above detailed  ACLM recommendations for nutrition, exercise, sleep, social life, avoidance of risky substances, the need for restorative sleep   information will also detailed on discharge instructions.  5) Chronic Care/Health Maintenance:  -he  is on ACEI/ARB and Statin medications and  is encouraged to initiate and continue to follow up with Ophthalmology, Dentist,  Podiatrist at least yearly or according to recommendations, and advised to   stay away from smoking. I have recommended yearly flu vaccine and pneumonia vaccine at least every 5 years; moderate intensity exercise for up to 150 minutes weekly; and  sleep for 7- 9 hours a day.  His DM Foot Exam is normal on December 16, 2022. He will also benefit from vitamin D supplement, 2000 units daily.  - he is  advised to maintain close follow up with Park Meo, FNP for primary care needs, as well as his other providers for optimal and  coordinated care.   I spent  26  minutes in the care of the patient today including review of labs from CMP, Lipids, Thyroid Function, Hematology (current and previous including abstractions from other facilities); face-to-face time discussing  his blood glucose readings/logs, discussing hypoglycemia and hyperglycemia episodes and symptoms, medications doses, his options of short and long term treatment based on the latest standards of care / guidelines;  discussion about incorporating lifestyle medicine;  and documenting the encounter. Risk reduction counseling performed per USPSTF guidelines to reduce  obesity and cardiovascular risk factors.     Please refer to Patient Instructions for Blood Glucose Monitoring and Insulin/Medications Dosing Guide"  in media tab for additional information. Please  also refer to " Patient Self Inventory" in the Media  tab for reviewed elements of pertinent patient history.  Thomas Ramsey participated in the discussions, expressed understanding, and voiced agreement with the above plans.  All questions were answered to his satisfaction. he is encouraged to contact clinic should he have any questions or concerns prior to his return visit.   Follow  up plan: - Return in about 6 months (around 06/22/2024) for Bring Meter/CGM Device/Logs- A1c in Office.  Marquis Lunch, MD [facility] [address] [place], [place] [address] Phone: [phone]  Fax: [phone]    12/22/2023, 7:07 PM  This note was partially dictated with voice recognition software. Similar sounding words can be transcribed inadequately or may not  be corrected upon review.

## 2023-12-22 NOTE — Patient Instructions (Signed)

## 2024-01-13 ENCOUNTER — Other Ambulatory Visit: Payer: Self-pay | Admitting: "Endocrinology

## 2024-01-13 DIAGNOSIS — E103293 Type 1 diabetes mellitus with mild nonproliferative diabetic retinopathy without macular edema, bilateral: Secondary | ICD-10-CM

## 2024-01-20 ENCOUNTER — Other Ambulatory Visit: Payer: Self-pay | Admitting: Nurse Practitioner

## 2024-01-20 DIAGNOSIS — E103293 Type 1 diabetes mellitus with mild nonproliferative diabetic retinopathy without macular edema, bilateral: Secondary | ICD-10-CM

## 2024-01-20 MED ORDER — NOVOLOG 100 UNIT/ML IJ SOLN: INTRAMUSCULAR | 3 refills | Status: DC

## 2024-02-07 ENCOUNTER — Other Ambulatory Visit: Payer: Self-pay | Admitting: Family Medicine

## 2024-02-07 DIAGNOSIS — E103293 Type 1 diabetes mellitus with mild nonproliferative diabetic retinopathy without macular edema, bilateral: Secondary | ICD-10-CM

## 2024-02-09 ENCOUNTER — Encounter: Payer: Self-pay | Admitting: "Endocrinology

## 2024-02-09 ENCOUNTER — Other Ambulatory Visit: Payer: Self-pay

## 2024-02-09 DIAGNOSIS — E103293 Type 1 diabetes mellitus with mild nonproliferative diabetic retinopathy without macular edema, bilateral: Secondary | ICD-10-CM

## 2024-02-09 MED ORDER — DEXCOM G6 SENSOR MISC: 0 refills | Status: DC

## 2024-02-24 ENCOUNTER — Other Ambulatory Visit: Payer: 59

## 2024-03-02 ENCOUNTER — Ambulatory Visit: Payer: 59 | Admitting: Family Medicine

## 2024-03-03 ENCOUNTER — Other Ambulatory Visit: Payer: Self-pay | Admitting: "Endocrinology

## 2024-03-03 DIAGNOSIS — E103293 Type 1 diabetes mellitus with mild nonproliferative diabetic retinopathy without macular edema, bilateral: Secondary | ICD-10-CM

## 2024-03-06 ENCOUNTER — Other Ambulatory Visit: Payer: Self-pay | Admitting: "Endocrinology

## 2024-03-06 ENCOUNTER — Encounter: Payer: Self-pay | Admitting: Family Medicine

## 2024-03-06 ENCOUNTER — Encounter: Payer: Self-pay | Admitting: "Endocrinology

## 2024-03-06 ENCOUNTER — Ambulatory Visit: Payer: 59 | Admitting: Family Medicine

## 2024-03-06 VITALS — BP 117/70 | HR 96 | Temp 97.5°F | Ht 72.0 in | Wt 241.4 lb

## 2024-03-06 DIAGNOSIS — E785 Hyperlipidemia, unspecified: Secondary | ICD-10-CM | POA: Diagnosis not present

## 2024-03-06 DIAGNOSIS — E103293 Type 1 diabetes mellitus with mild nonproliferative diabetic retinopathy without macular edema, bilateral: Secondary | ICD-10-CM | POA: Diagnosis not present

## 2024-03-06 DIAGNOSIS — I10 Essential (primary) hypertension: Secondary | ICD-10-CM

## 2024-03-06 DIAGNOSIS — E1069 Type 1 diabetes mellitus with other specified complication: Secondary | ICD-10-CM | POA: Diagnosis not present

## 2024-03-06 NOTE — Assessment & Plan Note (Signed)
 Since 2001. Followed by Endo Dr [place]. Last A1c 8.0%. Well controlled on current regimen. Continue Humalog  0-100 units daily via pump and Dexcom CGM. A1c and uACR ordered. Foot exam UTD. Vaccines UTD. Retinal eye exam scheduled. Recommend heart healthy diet such as Mediterranean diet with whole grains, fruits, vegetable, fish, lean meats, nuts, and olive oil. Limit salt. Encouraged moderate walking, 3-5 times/week for 30-50 minutes each session. Aim for at least 150 minutes.week. Goal should be pace of 3 miles/hours, or walking 1.5 miles in 30 minutes. Seek medical care for urinary frequency, extreme thirst, vision changes, lightheadedness, dizziness.

## 2024-03-06 NOTE — Assessment & Plan Note (Signed)
 BP at goal <130/80. Unmedicated. Recommend heart healthy diet such as Mediterranean diet with whole grains, fruits, vegetable, fish, lean meats, nuts, and olive oil. Limit salt. Encouraged moderate walking, 3-5 times/week for 30-50 minutes each session. Aim for at least 150 minutes.week. Goal should be pace of 3 miles/hours, or walking 1.5 miles in 30 minutes. Avoid tobacco products. Avoid excess alcohol. Take medications as prescribed and bring medications and blood pressure log with cuff to each office visit. Seek medical care for chest pain, palpitations, shortness of breath with exertion, dizziness/lightheadedness, vision changes, recurrent headaches, or swelling of extremities. Follow up in 6 months for CPE.

## 2024-03-06 NOTE — Progress Notes (Signed)
 Subjective:  HPI: Thomas Ramsey is a 43 y.o. male presenting on 03/06/2024 for No chief complaint on file.   HPI Patient is in today for follow-up for HTN and HLD. He is followed by Endocrinology Dr Monte Antonio for his DM1, last A1c 8.0% and using Dexcom 6 CGM and Tandem pump.  HYPERTENSION / HYPERLIPIDEMIA Satisfied with current treatment? yes Duration of hypertension: chronic BP monitoring frequency: not checking BP range:  BP medication side effects: unmedicated Past BP meds: none Duration of hyperlipidemia: chronic Cholesterol medication side effects: no Cholesterol supplements: none Past cholesterol medications: atorvastatin  Medication compliance: excellent compliance Aspirin: no Recent stressors: no Recurrent headaches: no Visual changes: no Palpitations: no Dyspnea: no Chest pain: no Lower extremity edema: no Dizzy/lightheaded: no     Review of Systems  All other systems reviewed and are negative.   Relevant past medical history reviewed and updated as indicated.   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 in thumbs or great toes   Type I (juvenile type) diabetes mellitus without mention of complication, not stated as uncontrolled      Past Surgical History:  Procedure Laterality Date   FINGER SURGERY  1984   Reattached Index finger (right hand)    TONSILLECTOMY  1992   TYMPANOSTOMY TUBE PLACEMENT  1984    Allergies and medications reviewed and updated.   Current Outpatient Medications:    atorvastatin  (LIPITOR) 80 MG tablet, Take 1 tablet by mouth once daily, Disp: 90 tablet, Rfl: 0   Continuous Blood Gluc Receiver (DEXCOM G6 RECEIVER) DEVI, USE AS DIRECTED FOR CONTINUOUS GLUCOSE MONITORING, Disp: , Rfl:    Continuous Glucose Sensor (DEXCOM G6 SENSOR) MISC, USE TO CHECK GLUCOSE ONCE DAILY AS DIRECTED,  CHANGING  SENSOR  EVERY  10  DAYS, Disp: 9 each, Rfl: 0   Continuous Glucose  Transmitter (DEXCOM G6 TRANSMITTER) MISC, USE AS DIRECTED FOR CONTINUOUS GLUCOSE MONITORING. REUSE TRAINSMITTER FOR 90 DAYS THEN DISCARD AND REPALCE, Disp: 1 each, Rfl: 1   NOVOLOG  100 UNIT/ML injection, Use with insulin  pump for TDD around 90units, Disp: 50 mL, Rfl: 3   Tadalafil  2.5 MG TABS, TAKE 1 TABLET BY MOUTH ONCE DAILY AS NEEDED FOR ERECTILE DYSFUNCTION, Disp: 30 tablet, Rfl: 0  No Known Allergies  Objective:   BP 117/70   Pulse 96   Ht 6' (1.829 m)   Wt 241 lb 6.4 oz (109.5 kg)   BMI 32.74 kg/m      03/06/2024    8:40 AM 12/22/2023    3:59 PM 11/15/2023    8:04 AM  Vitals with BMI  Height 6' 0 5' 11 6' 1  Weight 241 lbs 6 oz 239 lbs 3 oz 242 lbs  BMI 32.73 33.38 31.93  Systolic 117 138 528  Diastolic 70 78 98  Pulse 96 64 68     Physical Exam Vitals and nursing note reviewed.  Constitutional:      Appearance: Normal appearance. He is obese.  HENT:     Head: Normocephalic and atraumatic.   Cardiovascular:     Rate and Rhythm: Normal rate and regular rhythm.     Pulses: Normal pulses.     Heart sounds: Normal heart sounds.  Pulmonary:     Effort: Pulmonary effort is normal.     Breath sounds: Normal breath sounds.   Skin:    General: Skin is warm and dry.     Capillary  Refill: Capillary refill takes less than 2 seconds.   Neurological:     General: No focal deficit present.     Mental Status: He is alert and oriented to person, place, and time. Mental status is at baseline.   Psychiatric:        Mood and Affect: Mood normal.        Behavior: Behavior normal.        Thought Content: Thought content normal.        Judgment: Judgment normal.     Assessment & Plan:  Essential (primary) hypertension Assessment & Plan: BP at goal <130/80. Unmedicated. Recommend heart healthy diet such as Mediterranean diet with whole grains, fruits, vegetable, fish, lean meats, nuts, and olive oil. Limit salt. Encouraged moderate walking, 3-5 times/week for 30-50 minutes  each session. Aim for at least 150 minutes.week. Goal should be pace of 3 miles/hours, or walking 1.5 miles in 30 minutes. Avoid tobacco products. Avoid excess alcohol. Take medications as prescribed and bring medications and blood pressure log with cuff to each office visit. Seek medical care for chest pain, palpitations, shortness of breath with exertion, dizziness/lightheadedness, vision changes, recurrent headaches, or swelling of extremities. Follow up in 6 months for CPE.   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 Followed by Endo Dr Monte Antonio. Last A1c 8.0%. Well controlled on current regimen. Continue Humalog  0-100 units daily via pump and Dexcom CGM. A1c and uACR ordered. Foot exam UTD. Vaccines UTD. Retinal eye exam scheduled. Recommend heart healthy diet such as Mediterranean diet with whole grains, fruits, vegetable, fish, lean meats, nuts, and olive oil. Limit salt. Encouraged moderate walking, 3-5 times/week for 30-50 minutes each session. Aim for at least 150 minutes.week. Goal should be pace of 3 miles/hours, or walking 1.5 miles in 30 minutes. Seek medical care for urinary frequency, extreme thirst, vision changes, lightheadedness, dizziness.   Hyperlipidemia due to type 1 diabetes mellitus (HCC) Assessment & Plan: LDL 100. Discussed lifestyle modification. Continue Atorvastatin  80mg  daily. Your labs showed elevated cholesterol. I recommend consuming a heart healthy diet such as Mediterranean diet or DASH diet with whole grains, fruits, vegetable, fish, lean meats, nuts, and olive oil. Limit sweets and processed foods. I also encourage moderate intensity exercise 150 minutes weekly. This is 3-5 times weekly for 30-50 minutes each session. Goal should be pace of 3 miles/hours, or walking 1.5 miles in 30 minutes. The 10-year ASCVD risk score (Arnett DK, et al., 2019) is: 2%       Follow up plan:  Return in about 6 months (around 09/05/2024) for annual physical with labs 1 week prior.  Jenelle Mis, FNP

## 2024-03-06 NOTE — Assessment & Plan Note (Signed)
 LDL 100. Discussed lifestyle modification. Continue Atorvastatin  80mg  daily. Your labs showed elevated cholesterol. I recommend consuming a heart healthy diet such as Mediterranean diet or DASH diet with whole grains, fruits, vegetable, fish, lean meats, nuts, and olive oil. Limit sweets and processed foods. I also encourage moderate intensity exercise 150 minutes weekly. This is 3-5 times weekly for 30-50 minutes each session. Goal should be pace of 3 miles/hours, or walking 1.5 miles in 30 minutes. The 10-year ASCVD risk score (Arnett DK, et al., 2019) is: 2%

## 2024-03-07 ENCOUNTER — Other Ambulatory Visit: Payer: Self-pay

## 2024-03-07 DIAGNOSIS — E103293 Type 1 diabetes mellitus with mild nonproliferative diabetic retinopathy without macular edema, bilateral: Secondary | ICD-10-CM

## 2024-03-07 MED ORDER — DEXCOM G6 TRANSMITTER MISC: 1 refills | Status: AC

## 2024-03-28 ENCOUNTER — Other Ambulatory Visit: Payer: Self-pay | Admitting: Family Medicine

## 2024-03-28 DIAGNOSIS — E1169 Type 2 diabetes mellitus with other specified complication: Secondary | ICD-10-CM

## 2024-06-11 ENCOUNTER — Other Ambulatory Visit: Payer: Self-pay | Admitting: Nurse Practitioner

## 2024-06-11 DIAGNOSIS — E103293 Type 1 diabetes mellitus with mild nonproliferative diabetic retinopathy without macular edema, bilateral: Secondary | ICD-10-CM

## 2024-06-26 ENCOUNTER — Ambulatory Visit: Admitting: "Endocrinology

## 2024-07-19 ENCOUNTER — Encounter: Payer: Self-pay | Admitting: "Endocrinology

## 2024-07-24 ENCOUNTER — Other Ambulatory Visit: Payer: Self-pay

## 2024-07-24 DIAGNOSIS — E103293 Type 1 diabetes mellitus with mild nonproliferative diabetic retinopathy without macular edema, bilateral: Secondary | ICD-10-CM

## 2024-07-24 MED ORDER — DEXCOM G6 SENSOR MISC: 0 refills | Status: DC

## 2024-08-02 ENCOUNTER — Other Ambulatory Visit: Payer: Self-pay | Admitting: Family Medicine

## 2024-08-02 DIAGNOSIS — E1169 Type 2 diabetes mellitus with other specified complication: Secondary | ICD-10-CM

## 2024-08-23 ENCOUNTER — Encounter: Payer: Self-pay | Admitting: "Endocrinology

## 2024-08-23 ENCOUNTER — Ambulatory Visit: Admitting: "Endocrinology

## 2024-08-23 VITALS — BP 136/82 | HR 72 | Ht 72.0 in | Wt 248.2 lb

## 2024-08-23 DIAGNOSIS — E6609 Other obesity due to excess calories: Secondary | ICD-10-CM

## 2024-08-23 DIAGNOSIS — E103293 Type 1 diabetes mellitus with mild nonproliferative diabetic retinopathy without macular edema, bilateral: Secondary | ICD-10-CM | POA: Diagnosis not present

## 2024-08-23 DIAGNOSIS — E559 Vitamin D deficiency, unspecified: Secondary | ICD-10-CM

## 2024-08-23 DIAGNOSIS — Z6831 Body mass index (BMI) 31.0-31.9, adult: Secondary | ICD-10-CM

## 2024-08-23 DIAGNOSIS — E66811 Obesity, class 1: Secondary | ICD-10-CM | POA: Diagnosis not present

## 2024-08-23 DIAGNOSIS — E782 Mixed hyperlipidemia: Secondary | ICD-10-CM

## 2024-08-23 DIAGNOSIS — Z794 Long term (current) use of insulin: Secondary | ICD-10-CM

## 2024-08-23 LAB — POCT GLYCOSYLATED HEMOGLOBIN (HGB A1C): HbA1c, POC (controlled diabetic range): 7.7 % — AB (ref 0.0–7.0)

## 2024-08-23 NOTE — Patient Instructions (Signed)

## 2024-08-23 NOTE — Progress Notes (Signed)
 Endocrinology follow-up note       08/23/2024, 11:13 AM   Subjective:    Patient ID: Thomas Ramsey, male    DOB: 1981/03/16.  Thomas Ramsey is being seen in follow-up after he was seen in consultation for management of currently uncontrolled symptomatic diabetes requested by  Kayla Jeoffrey RAMAN, FNP.   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 in thumbs or great toes   Type I (juvenile type) diabetes mellitus without mention of complication, not stated as uncontrolled     Past Surgical History:  Procedure Laterality Date   FINGER SURGERY  1984   Reattached Index finger (right hand)    TONSILLECTOMY  1992   TYMPANOSTOMY TUBE PLACEMENT  1984    Social History   Socioeconomic History   Marital status: Single    Spouse name: Not on file   Number of children: Not on file   Years of education: Not on file   Highest education level: Some college, no degree  Occupational History    Employer: [employer]  Tobacco Use   Smoking status: Never   Smokeless tobacco: Never  Vaping Use   Vaping status: Never Used  Substance and Sexual Activity   Alcohol use: No   Drug use: No   Sexual activity: Yes  Other Topics Concern   Not on file  Social History Narrative   Not on file   Social Drivers of Health   Financial Resource Strain: Low Risk  (03/05/2024)   Overall Financial Resource Strain (CARDIA)    Difficulty of Paying Living Expenses: Not hard at all  Food Insecurity: No Food Insecurity (03/05/2024)   Hunger Vital Sign    Worried About Running Out of Food in the Last Year: Never true    Ran Out of Food in the Last Year: Never true  Transportation Needs: No Transportation Needs (03/05/2024)   PRAPARE - Administrator, Civil Service (Medical): No    Lack of Transportation (Non-Medical): No  Physical Activity:  Insufficiently Active (03/05/2024)   Exercise Vital Sign    Days of Exercise per Week: 2 days    Minutes of Exercise per Session: 30 min  Stress: No Stress Concern Present (03/05/2024)   [employer] of Occupational Health - Occupational Stress Questionnaire    Feeling of Stress: Not at all  Social Connections: Moderately Isolated (03/05/2024)   Social Connection and Isolation Panel    Frequency of Communication with Friends and Family: More than three times a week    Frequency of Social Gatherings with Friends and Family: Three times a week    Attends Religious Services: More than 4 times per year    Active Member of Clubs or Organizations: No    Attends Engineer, Structural: Not on file    Marital Status: Never married    Family History  Problem Relation Age of Onset   Thyroid disease Mother    Hyperlipidemia Mother    Cancer Father    Hyperlipidemia Father    Hypertension Father    Diabetes Neg Hx  Outpatient Encounter Medications as of 08/23/2024  Medication Sig   atorvastatin  (LIPITOR) 80 MG tablet Take 1 tablet by mouth once daily   Continuous Blood Gluc Receiver (DEXCOM G6 RECEIVER) DEVI USE AS DIRECTED FOR CONTINUOUS GLUCOSE MONITORING   Continuous Glucose Sensor (DEXCOM G6 SENSOR) MISC USE TO CHECK GLUCOSE ONCE DAILY AS DIRECTED,  CHANGING  SENSOR  EVERY  10  DAYS   Continuous Glucose Transmitter (DEXCOM G6 TRANSMITTER) MISC USE AS DIRECTED FOR CONTINUOUS GLUCOSE MONITORING. REUSE TRANSMITTER FOR 90 DAYS THEN DISCARD AND REPLACE   NOVOLOG  100 UNIT/ML injection Use with insulin  pump for TDD around 90 units   Tadalafil  2.5 MG TABS TAKE 1 TABLET BY MOUTH ONCE DAILY AS NEEDED FOR ERECTILE DYSFUNCTION   No facility-administered encounter medications on file as of 08/23/2024.    ALLERGIES: No Known Allergies  VACCINATION STATUS: Immunization History  Administered Date(s) Administered   Hep B, Unspecified 06/20/2019   Hepatitis B 06/20/2019   Influenza  Split 08/01/2013   Influenza, Seasonal, Injecte, Preservative Fre 09/02/2023   Influenza,inj,Quad PF,6+ Mos 06/16/2019   Influenza-Unspecified 08/01/2013, 01/22/2021   PFIZER(Purple Top)SARS-COV-2 Vaccination 11/11/2019, 12/05/2019   PPD Test 01/12/2022   Pneumococcal Polysaccharide-23 04/17/2014   Tdap 03/23/2016    Diabetes He presents for his follow-up diabetic visit. He has type 1 (?  Patient was diagnosed at approximate age of 18 years when he weighed 220 pounds.  He did have 1 epepisode of DKA when his blood glucose rose to 1280.  Patient does not recall if he ever had antibodies tested to classify his diabetes.) diabetes mellitus. His disease course has been improving. There are no hypoglycemic associated symptoms. Pertinent negatives for hypoglycemia include no confusion, headaches, pallor or seizures. Pertinent negatives for diabetes include no chest pain, no fatigue, no polydipsia, no polyphagia, no polyuria and no weakness. There are no hypoglycemic complications. Symptoms are improving. Diabetic complications include impotence and retinopathy. Risk factors for coronary artery disease include diabetes mellitus, dyslipidemia, male sex, obesity and family history. Current diabetic treatment includes insulin  pump. His weight is increasing steadily. He is following a generally unhealthy diet. When asked about meal planning, he reported none. He has not had a previous visit with a dietitian. He participates in exercise intermittently. His home blood glucose trend is decreasing steadily. His breakfast blood glucose range is generally 140-180 mg/dl. His lunch blood glucose range is generally 140-180 mg/dl. His dinner blood glucose range is generally 140-180 mg/dl. His bedtime blood glucose range is generally 140-180 mg/dl. His overall blood glucose range is 140-180 mg/dl. (He presents with improved glycemic profile.   He is CGM sensor results show 68% time range, 26% level 1 hyperglycemia, 5% level 2  hyperglycemia.  He has no significant hypoglycemia.  His point-of-care A1c 7.7% improving from 8%.     He wears t:slim insulin  pump with rapid acting insulin  settings  2 units/h for basal, ICR 1: 10, target glucose of 110, IAT5 hours, ISF: 1:23.  His average blood glucose is 167 mg per DL.  Insulin  utility: Basal 59%, bolus 41%. ) An ACE inhibitor/angiotensin II receptor blocker is being taken.  Hyperlipidemia This is a chronic problem. The current episode started more than 1 year ago. The problem is controlled. Pertinent negatives include no chest pain, myalgias or shortness of breath. Current antihyperlipidemic treatment includes statins. Risk factors for coronary artery disease include diabetes mellitus, dyslipidemia, male sex and family history.    Objective:       08/23/2024    8:52  AM 03/06/2024    8:40 AM 12/22/2023    3:59 PM  Vitals with BMI  Height 6' 0 6' 0 5' 11  Weight 248 lbs 3 oz 241 lbs 6 oz 239 lbs 3 oz  BMI 33.65 32.73 33.38  Systolic 136 117 861  Diastolic 82 70 78  Pulse 72 96 64    BP 136/82   Pulse 72   Ht 6' (1.829 m)   Wt 248 lb 3.2 oz (112.6 kg)   BMI 33.66 kg/m   Wt Readings from Last 3 Encounters:  08/23/24 248 lb 3.2 oz (112.6 kg)  03/06/24 241 lb 6.4 oz (109.5 kg)  12/22/23 239 lb 3.2 oz (108.5 kg)      CMP     Component Value Date/Time   NA 139 12/14/2023 1155   K 5.0 12/14/2023 1155   CL 99 12/14/2023 1155   CO2 25 12/14/2023 1155   GLUCOSE 175 (H) 12/14/2023 1155   GLUCOSE 173 (H) 09/10/2023 1017   BUN 12 12/14/2023 1155   CREATININE 1.05 12/14/2023 1155   CREATININE 0.91 09/10/2023 1017   CALCIUM  9.4 12/14/2023 1155   PROT 6.8 12/14/2023 1155   ALBUMIN 4.6 12/14/2023 1155   AST 22 12/14/2023 1155   ALT 42 12/14/2023 1155   ALKPHOS 125 (H) 12/14/2023 1155   BILITOT 0.6 12/14/2023 1155   GFRNONAA 103 06/09/2018 1004   GFRAA 120 06/09/2018 1004    Diabetic Labs (most recent): Lab Results  Component Value Date   HGBA1C 7.7 (A)  08/23/2024   HGBA1C 8.0 (A) 12/22/2023   HGBA1C 7.6 (A) 05/18/2023     Lipid Panel ( most recent) Lipid Panel     Component Value Date/Time   CHOL 149 12/14/2023 1155   TRIG 53 12/14/2023 1155   HDL 38 (L) 12/14/2023 1155   CHOLHDL 3.9 12/14/2023 1155   CHOLHDL 3.6 09/10/2023 1017   LDLCALC 100 (H) 12/14/2023 1155   LDLCALC 69 09/10/2023 1017   LABVLDL 11 12/14/2023 1155     Assessment & Plan:   1. Type 1  diabetes mellitus with mild nonproliferative retinopathy of both eyes without macular edema (HCC)   - Thomas Ramsey has currently uncontrolled symptomatic type 1 DM since  43 years of age.  He presents with improved glycemic profile.   He is CGM sensor results show 68% time range, 26% level 1 hyperglycemia, 5% level 2 hyperglycemia.  He has no significant hypoglycemia.  His point-of-care A1c 7.7% improving from 8%.     He wears t:slim insulin  pump with rapid acting insulin  settings  2 units/h for basal, ICR 1: 10, target glucose of 110, IAT5 hours, ISF: 1:23.  His average blood glucose is 167 mg per DL.  Insulin  utility: Basal 59%, bolus 41%.   Recent labs reviewed.  His previsit workup confirms type 1 diabetes with elevated levels of ZMT 8 antibodies as well as glutamic acid decarboxylase antibodies.  Patient was diagnosed at approximate age of 18 years when he weighed 220 pounds.  His recent antibody test measurements are consistent with type 1 diabetes.    He did have 1 episode of DKA in 2010 when his blood glucose rose to 1280.    - I had a long discussion with him about the possible risk factors and  the pathology behind its diabetes and its complications. Patient needs further workup to classify his diabetes-see below. -his diabetes is complicated by non proliferative retinopathy, high BMI of 31.65.    he  remains at a high risk for more acute and chronic complications which include CAD, CVA, CKD, retinopathy, and neuropathy. These are all discussed in detail with  him.  - I discussed all available options of managing his diabetes including de-escalation of medications. I have counseled him on Food as Medicine by adopting a Whole Food , Plant Predominant  ( WFPP) nutrition as recommended by [employer] of Lifestyle Medicine. Patient is encouraged to switch to  unprocessed or minimally processed  complex starch, adequate protein intake (mainly plant source), minimal liquid fat, plenty of fruits, and vegetables. -  he is advised to stick to a routine mealtimes to eat 3 complete meals a day and snack only when necessary ( to snack only to correct hypoglycemia BG <70 day time or <100 at night).   He is still a great candidate for lifestyle medicine, in light of the fact that he wishes to achieve weight loss.  - he acknowledges that there is a room for improvement in his food and drink choices. - Suggestion is made for him to avoid simple carbohydrates  from his diet including Cakes, Sweet Desserts, Ice Cream, Soda (diet and regular), Sweet Tea, Candies, Chips, Cookies, Store Bought Juices, Alcohol , Artificial Sweeteners,  Coffee Creamer, and Sugar-free Products, Lemonade. This will help patient to have more stable blood glucose profile and potentially avoid unintended weight gain.    He wears t:slim tandem insulin  pump with Dexcom CGM with proper utility of NovoLog .   His settings were reviewed as follows: -To give him slightly better control of glycemia, I revised his basal insulin  to 2 units/h for a total of 48 units of basal insulin  daily, continue ICR 1: 10, target glucose of 110, IAT5 hours, ISF: 1:23. - he has been a visit with  Santana Duke, RDN, CDE for individualized diabetes education.  He is getting better with carb counting. -Given his positive workup for type 1 diabetes, he does not have non-insulin  treatment options.  He is encouraged to use his Dexcom CGM.  He is advised to call clinic for hypoglycemia under 70 or hyperglycemia above 200  mg per DL.  - Specific targets for  A1c;  LDL, HDL,  and Triglycerides were discussed with the patient.  2) Blood Pressure /Hypertension: His blood pressure is controlled to target.  he is currently on lisinopril  5 mg p.o. daily.    3) Lipids/Hyperlipidemia:   Review of his recent lipid panel showed worsening LDL at 100.  He is advised to continue Lipitor 80 mg p.o. daily at bedtime.    Side effects and precautions discussed with him.  4)  Weight/Diet:  Body mass index is 33.66 kg/m.  -   clearly complicating his diabetes care.  He wishes to achieve appropriate weight loss.  We discussed on some goals.   he is  a candidate for weight loss. I discussed with him the fact that loss of 5 - 10% of his  current body weight will have the most impact on his diabetes management.  The above detailed  ACLM recommendations for nutrition, exercise, sleep, social life, avoidance of risky substances, the need for restorative sleep   information will also detailed on discharge instructions.  5) Chronic Care/Health Maintenance:  -he  is on ACEI/ARB and Statin medications and  is encouraged to initiate and continue to follow up with Ophthalmology, Dentist,  Podiatrist at least yearly or according to recommendations, and advised to   stay away from smoking. I have recommended yearly  flu vaccine and pneumonia vaccine at least every 5 years; moderate intensity exercise for up to 150 minutes weekly; and  sleep for 7- 9 hours a day.  His DM Foot Exam is normal on December 16, 2022. He will also benefit from vitamin D  supplement, 2000 units daily.  - he is  advised to maintain close follow up with Kayla Jeoffrey RAMAN, FNP for primary care needs, as well as his other providers for optimal and coordinated care.   I spent  40  minutes in the care of the patient today including review of labs from CMP, Lipids, Thyroid Function, Hematology (current and previous including abstractions from other facilities); face-to-face time  discussing  his blood glucose readings/logs, discussing hypoglycemia and hyperglycemia episodes and symptoms, medications doses, his options of short and long term treatment based on the latest standards of care / guidelines;  discussion about incorporating lifestyle medicine;  and documenting the encounter. Risk reduction counseling performed per USPSTF guidelines to reduce  obesity and cardiovascular risk factors.     Please refer to Patient Instructions for Blood Glucose Monitoring and Insulin /Medications Dosing Guide  in media tab for additional information. Please  also refer to  Patient Self Inventory in the Media  tab for reviewed elements of pertinent patient history.  Thomas Ramsey participated in the discussions, expressed understanding, and voiced agreement with the above plans.  All questions were answered to his satisfaction. he is encouraged to contact clinic should he have any questions or concerns prior to his return visit.   Follow up plan: - Return in about 6 months (around 02/21/2025) for F/U with Pre-visit Labs, Meter/CGM/Logs, A1c here.  Ranny Earl, MD [facility] [address] [place], [place] [address] Phone: [phone]  Fax: [phone]    08/23/2024, 11:13 AM  This note was partially dictated with voice recognition software. Similar sounding words can be transcribed inadequately or may not  be corrected upon review.

## 2024-09-04 ENCOUNTER — Ambulatory Visit (INDEPENDENT_AMBULATORY_CARE_PROVIDER_SITE_OTHER): Admitting: Family Medicine

## 2024-09-04 VITALS — BP 137/85 | HR 73 | Temp 98.2°F | Ht 72.0 in | Wt 242.4 lb

## 2024-09-04 DIAGNOSIS — E103293 Type 1 diabetes mellitus with mild nonproliferative diabetic retinopathy without macular edema, bilateral: Secondary | ICD-10-CM | POA: Diagnosis not present

## 2024-09-04 DIAGNOSIS — E1069 Type 1 diabetes mellitus with other specified complication: Secondary | ICD-10-CM

## 2024-09-04 DIAGNOSIS — Z0000 Encounter for general adult medical examination without abnormal findings: Secondary | ICD-10-CM

## 2024-09-04 DIAGNOSIS — E559 Vitamin D deficiency, unspecified: Secondary | ICD-10-CM

## 2024-09-04 DIAGNOSIS — R0789 Other chest pain: Secondary | ICD-10-CM | POA: Diagnosis not present

## 2024-09-04 DIAGNOSIS — Z23 Encounter for immunization: Secondary | ICD-10-CM | POA: Diagnosis not present

## 2024-09-04 DIAGNOSIS — E785 Hyperlipidemia, unspecified: Secondary | ICD-10-CM

## 2024-09-04 DIAGNOSIS — I152 Hypertension secondary to endocrine disorders: Secondary | ICD-10-CM

## 2024-09-04 MED ORDER — LISINOPRIL 10 MG PO TABS: 10.0000 mg | ORAL_TABLET | Freq: Every day | ORAL | 3 refills | Status: AC

## 2024-09-04 NOTE — Progress Notes (Signed)
 Complete physical exam  Patient: Thomas Ramsey    DOB: 07/11/1981 43 y.o.   MRN: [id number]  Chief Complaint  Patient presents with   Annual Exam    CPE    Subjective:    Thomas Ramsey is a 43 y.o. male who presents today for a complete physical exam. He reports consuming a diabetic diet. The patient does not participate in regular exercise at present. He generally feels well. He reports sleeping well. He does not have additional problems to discuss today.   PMH includes DM1, HLD, HTN  DM1: followed by Dr [facility], using t-slim and Dexcom, Novolog  Lab Results  Component Value Date   HGBA1C 7.7 (A) 08/23/2024   HGBA1C 8.0 (A) 12/22/2023   HGBA1C 7.6 (A) 05/18/2023   HTN: previously on Lisinopril  5mg  daily Denies chest pain, palpitations, recurrent headaches, vision changes, lightheadedness, dizziness, dyspnea on exertion, or swelling of extremities.  HLD: on Lipitor 80mg  daily Lipid Panel     Component Value Date/Time   CHOL 149 12/14/2023 1155   TRIG 53 12/14/2023 1155   HDL 38 (L) 12/14/2023 1155   CHOLHDL 3.9 12/14/2023 1155   CHOLHDL 3.6 09/10/2023 1017   LDLCALC 100 (H) 12/14/2023 1155   LDLCALC 69 09/10/2023 1017   LABVLDL 11 12/14/2023 1155     Discussed the use of AI scribe software for clinical note transcription with the patient, who gave verbal consent to proceed.  History of Present Illness Thomas Ramsey is a 43 year old male with diabetes who presents for physical exam.  He has been managing his diabetes well with dietary changes. His A1c was previously 7.7, and he has been working to lower it by following a plant-based diet. Blood sugar levels have improved since December 3rd, with fewer spikes, although he sometimes experiences high sugar levels after consuming fruit. He uses a T slim pump for insulin  management but has had issues with the Dexcom app not working on his phone. He is currently taking atorvastatin  and  insulin .  He experiences intermittent twinges of chest pain on the left side, described as a 'bolt of electricity' that occurs occasionally and is not associated with exertion. This has been happening for about a week, possibly related to moving a heavy gun case. He has a history of a cardiac event where his heart stopped, and he was told there was damage to the left side of his heart. Denies associated lightheadedness, dizziness, palpitations, shortness of breath.  His blood pressure was noted to be 137/85, which is higher than desired. He does not currently take any blood pressure medication, although he was on lisinopril  in the past. No symptoms such as headaches, vision changes, chest pain, palpitations, leg swelling, shortness of breath, or dizziness.  No issues with hearing, vision, dental health, or skin concerns. He does not smoke or drink alcohol. No shortness of breath, wheezing, abdominal pain, blood in stool, or urinary issues. He had a foot exam on December 3rd.   Most recent fall risk assessment:    09/04/2024    8:11 AM  Fall Risk   Falls in the past year? 0  Number falls in past yr: 0  Injury with Fall? 0  Risk for fall due to : No Fall Risks  Follow up Falls evaluation completed     Most recent depression screenings:    09/04/2024    8:15 AM 03/06/2024    1:04 PM  PHQ 2/9 Scores  PHQ - 2 Score  0 0  PHQ- 9 Score 0     Vision:Within last year and Dental:  Patient Active Problem List   Diagnosis Date Noted   Physical exam, annual 09/02/2023   Hyperlipidemia due to type 1 diabetes mellitus (HCC) 09/02/2023   Vitamin D  deficiency 12/31/2022   Eczema, dyshidrotic 06/16/2019   Obesity 12/14/2018   Hypertensive retinopathy of both eyes 02/15/2018   Nuclear sclerotic cataract of both eyes 02/15/2018   Erectile dysfunction 12/21/2017   Long-term insulin  use (HCC) 10/29/2014   Type 1 diabetes mellitus with mild nonproliferative retinopathy (HCC) 05/29/2014    Hypertension associated with diabetes (HCC) 05/29/2014   Mild nonproliferative diabetic retinopathy (HCC) 01/25/2014   Chondromalacia of both patellae 10/18/2013   IBS (irritable bowel syndrome) 10/04/2013   Diabetes mellitus type 1 (HCC) 08/29/2013   Insulin  pump status 08/29/2013   Essential (primary) hypertension 08/29/2013   Type 1 diabetes mellitus with hyperlipidemia (HCC) 08/29/2013   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 in thumbs or great toes   Type I (juvenile type) diabetes mellitus without mention of complication, not stated as uncontrolled    Past Surgical History:  Procedure Laterality Date   FINGER SURGERY  1984   Reattached Index finger (right hand)    TONSILLECTOMY  1992   TYMPANOSTOMY TUBE PLACEMENT  1984   Social History[1] Family History  Problem Relation Age of Onset   Thyroid disease Mother    Hyperlipidemia Mother    Cancer Father    Hyperlipidemia Father    Hypertension Father    Diabetes Neg Hx    Allergies[2]    Patient Care Team: Kayla Jeoffrey RAMAN, FNP as PCP - General (Family Medicine)   Review of Systems  Constitutional: Negative.   HENT: Negative.    Eyes: Negative.   Respiratory: Negative.    Cardiovascular: Negative.   Gastrointestinal: Negative.   Genitourinary: Negative.   Musculoskeletal: Negative.   Skin: Negative.   Neurological: Negative.   Endo/Heme/Allergies: Negative.   Psychiatric/Behavioral: Negative.    All other systems reviewed and are negative.     Objective:    BP 137/85   Pulse 73   Temp 98.2 F (36.8 C)   Ht 6' (1.829 m)   Wt 242 lb 6.4 oz (110 kg)   SpO2 98%   BMI 32.88 kg/m  BP Readings from Last 3 Encounters:  09/04/24 137/85  08/23/24 136/82  03/06/24 117/70   Wt Readings from Last 3 Encounters:  09/04/24 242 lb 6.4 oz (110 kg)  08/23/24 248 lb 3.2 oz (112.6 kg)  03/06/24 241 lb 6.4 oz (109.5 kg)      Physical  Exam Vitals and nursing note reviewed.  Constitutional:      Appearance: Normal appearance. He is obese.  HENT:     Head: Normocephalic and atraumatic.     Right Ear: Tympanic membrane, ear canal and external ear normal.     Left Ear: Tympanic membrane, ear canal and external ear normal.     Nose: Nose normal.     Mouth/Throat:     Mouth: Mucous membranes are moist.     Pharynx: Oropharynx is clear.  Eyes:     Extraocular Movements: Extraocular movements intact.     Right eye: Normal extraocular motion and no nystagmus.     Left eye: Normal extraocular motion and no nystagmus.     Conjunctiva/sclera: Conjunctivae normal.     Pupils:  Pupils are equal, round, and reactive to light.  Cardiovascular:     Rate and Rhythm: Normal rate and regular rhythm.     Pulses: Normal pulses.     Heart sounds: Normal heart sounds.  Pulmonary:     Effort: Pulmonary effort is normal.     Breath sounds: Normal breath sounds.  Abdominal:     General: Bowel sounds are normal.     Palpations: Abdomen is soft.  Genitourinary:    Comments: Deferred using shared decision making Musculoskeletal:        General: Normal range of motion.     Cervical back: Normal range of motion and neck supple.  Skin:    General: Skin is warm and dry.     Capillary Refill: Capillary refill takes less than 2 seconds.  Neurological:     General: No focal deficit present.     Mental Status: He is alert. Mental status is at baseline.  Psychiatric:        Mood and Affect: Mood normal.        Speech: Speech normal.        Behavior: Behavior normal.        Thought Content: Thought content normal.        Cognition and Memory: Cognition and memory normal.        Judgment: Judgment normal.       No results found for any visits on 09/04/24. Last CBC Lab Results  Component Value Date   WBC 7.6 09/10/2023   HGB 15.1 09/10/2023   HCT 44.7 09/10/2023   MCV 87.5 09/10/2023   MCH 29.5 09/10/2023   RDW 12.1 09/10/2023    PLT 311 09/10/2023   Last metabolic panel Lab Results  Component Value Date   GLUCOSE 175 (H) 12/14/2023   NA 139 12/14/2023   K 5.0 12/14/2023   CL 99 12/14/2023   CO2 25 12/14/2023   BUN 12 12/14/2023   CREATININE 1.05 12/14/2023   EGFR 91 12/14/2023   CALCIUM  9.4 12/14/2023   PROT 6.8 12/14/2023   ALBUMIN 4.6 12/14/2023   LABGLOB 2.2 12/14/2023   BILITOT 0.6 12/14/2023   ALKPHOS 125 (H) 12/14/2023   AST 22 12/14/2023   ALT 42 12/14/2023   Last lipids Lab Results  Component Value Date   CHOL 149 12/14/2023   HDL 38 (L) 12/14/2023   LDLCALC 100 (H) 12/14/2023   TRIG 53 12/14/2023   CHOLHDL 3.9 12/14/2023   Last hemoglobin A1c Lab Results  Component Value Date   HGBA1C 7.7 (A) 08/23/2024   Last thyroid functions Lab Results  Component Value Date   TSH 0.734 12/14/2023   FREET4 1.30 12/14/2023   Last vitamin D  Lab Results  Component Value Date   VD25OH 35.6 12/14/2023   Last vitamin B12 and Folate Lab Results  Component Value Date   VITAMINB12 551 12/14/2023         Assessment & Plan:    Routine Health Maintenance and Physical Exam Immunization History  Administered Date(s) Administered   Hep B, Unspecified 06/20/2019   Hepatitis B 06/20/2019   Hepb-cpg 09/04/2024   Influenza Split 08/01/2013   Influenza, Seasonal, Injecte, Preservative Fre 09/02/2023   Influenza,inj,Quad PF,6+ Mos 06/16/2019   Influenza-Unspecified 08/01/2013, 01/22/2021   PFIZER(Purple Top)SARS-COV-2 Vaccination 11/11/2019, 12/05/2019   PNEUMOCOCCAL CONJUGATE-20 09/04/2024   PPD Test 01/12/2022   Pneumococcal Polysaccharide-23 04/17/2014   Tdap 03/23/2016    Health Maintenance  Topic Date Due   Influenza Vaccine  04/21/2024  Diabetic kidney evaluation - Urine ACR  09/09/2024   Hepatitis B Vaccines 19-59 Average Risk (3 of 3 - 19+ 3-dose series) 09/04/2025 (Originally 10/30/2024)   HPV VACCINES (1 - 3-dose SCDM series) 09/04/2025 (Originally 04/18/2008)   OPHTHALMOLOGY  EXAM  09/09/2024   Diabetic kidney evaluation - eGFR measurement  12/13/2024   HEMOGLOBIN A1C  02/21/2025   FOOT EXAM  08/23/2025   DTaP/Tdap/Td (2 - Td or Tdap) 03/23/2026   Pneumococcal Vaccine  Completed   Hepatitis C Screening  Completed   HIV Screening  Completed   Meningococcal B Vaccine  Aged Out   COVID-19 Vaccine  Discontinued    Discussed health benefits of physical activity, and encouraged him to engage in regular exercise appropriate for his age and condition.  Problem List Items Addressed This Visit       Cardiovascular and Mediastinum   Hypertension associated with diabetes (HCC)   Relevant Medications   lisinopril  (ZESTRIL ) 10 MG tablet   Other Relevant Orders   CBC with Differential/Platelet   Comprehensive metabolic panel with GFR   Lipid panel   EKG 12-Lead     Endocrine   Diabetes mellitus type 1 (HCC)   Relevant Medications   lisinopril  (ZESTRIL ) 10 MG tablet   Other Relevant Orders   Microalbumin / creatinine urine ratio   Hyperlipidemia due to type 1 diabetes mellitus (HCC)   Relevant Medications   lisinopril  (ZESTRIL ) 10 MG tablet   Other Relevant Orders   CBC with Differential/Platelet   Comprehensive metabolic panel with GFR   Lipid panel   EKG 12-Lead     Other   Vitamin D  deficiency   Relevant Orders   Vitamin D , 25-hydroxy   Physical exam, annual - Primary   Relevant Orders   CBC with Differential/Platelet   Comprehensive metabolic panel with GFR   Lipid panel   Microalbumin / creatinine urine ratio   Vitamin D , 25-hydroxy   EKG 12-Lead   Other Visit Diagnoses       Need for vaccination       Relevant Orders   Pneumococcal conjugate vaccine 20-valent (Prevnar 20) (Completed)   Heplisav-B  (HepB-CPG) Vaccine (Completed)     Atypical chest pain           Assessment and Plan Assessment & Plan Type 1 diabetes mellitus A1c is 7.7%, above target. Dexcom app not functioning. Plans to switch to [place] system in March. - Followed  by Endo, keep appointments. - Working on improvements in diet and exercise. - Discontinued tadalafil .  Hypertension associated with diabetes Blood pressure elevated at 137/85 mmHg. No current antihypertensive medication. - Restarted lisinopril  to achieve target blood pressure of <130/80 mmHg.  Atypical chest pain Intermittent left-sided chest pain lasting a few seconds, possibly related to recent heavy lifting. - Ordered EKG to evaluate cardiac status. NSR - Advised on ED precautions and when to seek medical care.  - Follow up if symptoms persist.  General Health Maintenance Due for pneumonia and flu vaccines.    Return in about 4 weeks (around 10/02/2024) for hypertension.    Jeoffrey GORMAN Barrio, FNP [facility]       [1]  Social History Tobacco Use   Smoking status: Never   Smokeless tobacco: Never  Vaping Use   Vaping status: Never Used  Substance Use Topics   Alcohol use: Not Currently   Drug use: Never  [2] No Known Allergies

## 2024-09-05 ENCOUNTER — Ambulatory Visit: Payer: Self-pay | Admitting: Family Medicine

## 2024-09-05 LAB — CBC WITH DIFFERENTIAL/PLATELET
Absolute Lymphocytes: 1395 {cells}/uL (ref 850–3900)
Absolute Monocytes: 503 {cells}/uL (ref 200–950)
Basophils Absolute: 53 {cells}/uL (ref 0–200)
Basophils Relative: 0.7 %
Eosinophils Absolute: 158 {cells}/uL (ref 15–500)
Eosinophils Relative: 2.1 %
HCT: 48 % (ref 39.4–51.1)
Hemoglobin: 15.9 g/dL (ref 13.2–17.1)
MCH: 28.8 pg (ref 27.0–33.0)
MCHC: 33.1 g/dL (ref 31.6–35.4)
MCV: 86.8 fL (ref 81.4–101.7)
MPV: 9.8 fL (ref 7.5–12.5)
Monocytes Relative: 6.7 %
Neutro Abs: 5393 {cells}/uL (ref 1500–7800)
Neutrophils Relative %: 71.9 %
Platelets: 318 Thousand/uL (ref 140–400)
RBC: 5.53 Million/uL (ref 4.20–5.80)
RDW: 12.6 % (ref 11.0–15.0)
Total Lymphocyte: 18.6 %
WBC: 7.5 Thousand/uL (ref 3.8–10.8)

## 2024-09-05 LAB — LIPID PANEL
Cholesterol: 109 mg/dL (ref <200)
HDL: 34 mg/dL — ABNORMAL LOW (ref >=40)
LDL Cholesterol (Calc): 62 mg/dL
Non-HDL Cholesterol (Calc): 75 mg/dL (ref <130)
Total CHOL/HDL Ratio: 3.2 (calc) (ref <5.0)
Triglycerides: 57 mg/dL (ref <150)

## 2024-09-05 LAB — COMPREHENSIVE METABOLIC PANEL WITH GFR
AG Ratio: 2.1 (calc) (ref 1.0–2.5)
ALT: 43 U/L (ref 9–46)
AST: 26 U/L (ref 10–40)
Albumin: 4.7 g/dL (ref 3.6–5.1)
Alkaline phosphatase (APISO): 105 U/L (ref 36–130)
BUN: 12 mg/dL (ref 7–25)
CO2: 27 mmol/L (ref 20–32)
Calcium: 9.2 mg/dL (ref 8.6–10.3)
Chloride: 99 mmol/L (ref 98–110)
Creat: 0.95 mg/dL (ref 0.60–1.29)
Globulin: 2.2 g/dL (ref 1.9–3.7)
Glucose, Bld: 182 mg/dL — ABNORMAL HIGH (ref 65–99)
Potassium: 4.3 mmol/L (ref 3.5–5.3)
Sodium: 138 mmol/L (ref 135–146)
Total Bilirubin: 0.7 mg/dL (ref 0.2–1.2)
Total Protein: 6.9 g/dL (ref 6.1–8.1)
eGFR: 102 mL/min/1.73m2 (ref >=60)

## 2024-09-05 LAB — VITAMIN D 25 HYDROXY (VIT D DEFICIENCY, FRACTURES): Vit D, 25-Hydroxy: 33 ng/mL (ref 30–100)

## 2024-09-05 LAB — MICROALBUMIN / CREATININE URINE RATIO
Creatinine, Urine: 49 mg/dL (ref 20–320)
Microalb Creat Ratio: 6 mg/g{creat} (ref <30)
Microalb, Ur: 0.3 mg/dL

## 2024-09-24 ENCOUNTER — Encounter: Payer: Self-pay | Admitting: Family Medicine

## 2024-09-26 ENCOUNTER — Encounter: Payer: Self-pay | Admitting: Family Medicine

## 2024-09-26 ENCOUNTER — Ambulatory Visit: Admitting: Family Medicine

## 2024-09-26 VITALS — BP 120/79 | HR 76 | Temp 97.9°F | Ht 72.0 in | Wt 240.1 lb

## 2024-09-26 DIAGNOSIS — E1159 Type 2 diabetes mellitus with other circulatory complications: Secondary | ICD-10-CM

## 2024-09-26 DIAGNOSIS — Z794 Long term (current) use of insulin: Secondary | ICD-10-CM

## 2024-09-26 DIAGNOSIS — I152 Hypertension secondary to endocrine disorders: Secondary | ICD-10-CM | POA: Diagnosis not present

## 2024-09-26 DIAGNOSIS — K529 Noninfective gastroenteritis and colitis, unspecified: Secondary | ICD-10-CM | POA: Insufficient documentation

## 2024-09-26 NOTE — Progress Notes (Signed)
 "  Acute Office Visit  Patient ID: Thomas Ramsey, male    DOB: 05/29/1981, 44 y.o.   MRN: [id number]  PCP: Kayla Jeoffrey RAMAN, FNP  Chief Complaint  Patient presents with   Acute Visit    Follow up after stomach bug. Needs RTW note.      Subjective:     HPI  Discussed the use of AI scribe software for clinical note transcription with the patient, who gave verbal consent to proceed.  History of Present Illness Thomas Ramsey is a 44 year old male with hypertension who presents for follow-up of blood pressure management and recent illness.  Blood pressure readings have been variable, with a recent reading of 141/89 mmHg in the morning. He is currently taking lisinopril  10 mg, which was increased from 5 mg three weeks ago. He takes his blood pressure and cholesterol medications at 6 PM daily. He has a mild dry cough since starting lisinopril , but it is not bothersome. No chest pain, palpitations, vision changes, headaches, or any other complications from the medication.  He experienced an acute illness last weekend with a fever of 101F, body aches, nausea, vomiting, and diarrhea, which lasted from Thursday to Sunday. He has been fever-free for 48 hours and reports feeling better, although he still cannot taste his food properly. He describes a metallic or 'aluminum' taste in his mouth, which he associates with an infection. He has been consuming chicken broth and Gatorade Zero to maintain hydration. He denies any new medications or supplements and has no known vitamin deficiencies.   Review of Systems  All other systems reviewed and are negative.   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 in thumbs or great toes   Type I (juvenile type) diabetes mellitus without mention of complication, not stated as uncontrolled     Past Surgical History:  Procedure Laterality Date   FINGER SURGERY  1984   Reattached Index  finger (right hand)    TONSILLECTOMY  1992   TYMPANOSTOMY TUBE PLACEMENT  1984    Outpatient Medications Prior to Visit  Medication Sig Dispense Refill   atorvastatin  (LIPITOR) 80 MG tablet Take 1 tablet by mouth once daily 90 tablet 0   Continuous Blood Gluc Receiver (DEXCOM G6 RECEIVER) DEVI USE AS DIRECTED FOR CONTINUOUS GLUCOSE MONITORING     Continuous Glucose Sensor (DEXCOM G6 SENSOR) MISC USE TO CHECK GLUCOSE ONCE DAILY AS DIRECTED,  CHANGING  SENSOR  EVERY  10  DAYS 9 each 0   Continuous Glucose Transmitter (DEXCOM G6 TRANSMITTER) MISC USE AS DIRECTED FOR CONTINUOUS GLUCOSE MONITORING. REUSE TRANSMITTER FOR 90 DAYS THEN DISCARD AND REPLACE 1 each 1   lisinopril  (ZESTRIL ) 10 MG tablet Take 1 tablet (10 mg total) by mouth daily. 90 tablet 3   NOVOLOG  100 UNIT/ML injection Use with insulin  pump for TDD around 90 units 50 mL 0   No facility-administered medications prior to visit.    Allergies[1]     Objective:    BP 120/79   Pulse 76   Temp 97.9 F (36.6 C)   Ht 6' (1.829 m)   Wt 240 lb 1 oz (108.9 kg)   SpO2 98%   BMI 32.56 kg/m  BP Readings from Last 3 Encounters:  09/26/24 120/79  09/04/24 137/85  08/23/24 136/82   Wt Readings from Last 3 Encounters:  09/26/24 240 lb 1 oz (108.9 kg)  09/04/24 242 lb  6.4 oz (110 kg)  08/23/24 248 lb 3.2 oz (112.6 kg)      Physical Exam Vitals and nursing note reviewed.  Constitutional:      Appearance: Normal appearance. He is normal weight.  HENT:     Head: Normocephalic and atraumatic.  Cardiovascular:     Rate and Rhythm: Normal rate and regular rhythm.     Pulses: Normal pulses.     Heart sounds: Normal heart sounds.  Pulmonary:     Effort: Pulmonary effort is normal.     Breath sounds: Normal breath sounds.  Skin:    General: Skin is warm and dry.     Capillary Refill: Capillary refill takes less than 2 seconds.  Neurological:     General: No focal deficit present.     Mental Status: He is alert and oriented to  person, place, and time. Mental status is at baseline.  Psychiatric:        Mood and Affect: Mood normal.        Behavior: Behavior normal.        Thought Content: Thought content normal.        Judgment: Judgment normal.       No results found for any visits on 09/26/24.     Assessment & Plan:   Problem List Items Addressed This Visit       Cardiovascular and Mediastinum   Hypertension associated with diabetes (HCC) - Primary     Digestive   Gastroenteritis    Assessment and Plan Assessment & Plan Acute gastroenteritis with dehydration Symptoms resolved, metallic taste possibly due to electrolyte disturbance. - Encouraged intake of electrolyte fluids like Gatorade Zero. - Provided work clearance note.  Hypertension Blood pressure controlled on lisinopril  10 mg. Mild dry cough likely due to lisinopril . - Continue lisinopril  10 mg daily. - Monitor blood pressure at home. - Bring cuff to next appointment for calibration. - follow up in 3 months     No orders of the defined types were placed in this encounter.   Return in about 3 months (around 12/25/2024) for chronic follow-up with labs 1 week prior.  Jeoffrey GORMAN Barrio, FNP [facility]      [1] No Known Allergies  "

## 2024-09-27 ENCOUNTER — Other Ambulatory Visit: Payer: Self-pay | Admitting: Nurse Practitioner

## 2024-09-27 DIAGNOSIS — E103293 Type 1 diabetes mellitus with mild nonproliferative diabetic retinopathy without macular edema, bilateral: Secondary | ICD-10-CM

## 2024-10-05 ENCOUNTER — Ambulatory Visit: Admitting: Family Medicine

## 2024-12-26 ENCOUNTER — Ambulatory Visit: Admitting: Family Medicine

## 2025-02-22 ENCOUNTER — Ambulatory Visit: Admitting: "Endocrinology
